# Patient Record
Sex: Female | Born: 1995 | Race: Black or African American | Hispanic: No | Marital: Single | Smoking: Former smoker
Health system: Southern US, Community
[De-identification: ages and names within clinical notes are randomized; demographics above are authoritative.]

## PROBLEM LIST (undated history)

## (undated) ENCOUNTER — Inpatient Hospital Stay (HOSPITAL_COMMUNITY): Payer: Self-pay

## (undated) DIAGNOSIS — Z789 Other specified health status: Secondary | ICD-10-CM

## (undated) HISTORY — PX: NO PAST SURGERIES: SHX2092

---
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
## 2016-05-24 ENCOUNTER — Ambulatory Visit (INDEPENDENT_AMBULATORY_CARE_PROVIDER_SITE_OTHER): Payer: Medicaid Other | Admitting: Obstetrics

## 2016-05-24 ENCOUNTER — Encounter: Payer: Self-pay | Admitting: *Deleted

## 2016-05-24 ENCOUNTER — Encounter: Payer: Self-pay | Admitting: Obstetrics

## 2016-05-24 ENCOUNTER — Other Ambulatory Visit (HOSPITAL_COMMUNITY)
Admission: RE | Admit: 2016-05-24 | Discharge: 2016-05-24 | Disposition: A | Discharge status: Home / Self Care | Payer: Medicaid Other | Source: Ambulatory Visit | Attending: Obstetrics | Admitting: Obstetrics

## 2016-05-24 VITALS — BP 116/75 | HR 87 | Ht 65.0 in | Wt 218.4 lb

## 2016-05-24 DIAGNOSIS — Z3041 Encounter for surveillance of contraceptive pills: Secondary | ICD-10-CM

## 2016-05-24 DIAGNOSIS — Z0000 Encounter for general adult medical examination without abnormal findings: Secondary | ICD-10-CM | POA: Diagnosis not present

## 2016-05-24 DIAGNOSIS — Z23 Encounter for immunization: Secondary | ICD-10-CM

## 2016-05-24 DIAGNOSIS — Z01419 Encounter for gynecological examination (general) (routine) without abnormal findings: Secondary | ICD-10-CM

## 2016-05-24 MED ORDER — NORETHIN ACE-ETH ESTRAD-FE 1-20 MG-MCG(24) PO TABS: 1.0000 | ORAL_TABLET | Freq: Every day | ORAL | 11 refills | Status: DC
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
## 2016-05-25 LAB — CERVICOVAGINAL ANCILLARY ONLY
Bacterial vaginitis: POSITIVE — AB
CHLAMYDIA, DNA PROBE: POSITIVE — AB
Candida vaginitis: NEGATIVE
NEISSERIA GONORRHEA: NEGATIVE
Trichomonas: NEGATIVE

## 2016-05-27 ENCOUNTER — Other Ambulatory Visit: Payer: Self-pay | Admitting: Obstetrics

## 2016-05-27 DIAGNOSIS — N760 Acute vaginitis: Secondary | ICD-10-CM

## 2016-05-27 DIAGNOSIS — A749 Chlamydial infection, unspecified: Secondary | ICD-10-CM

## 2016-05-27 DIAGNOSIS — B9689 Other specified bacterial agents as the cause of diseases classified elsewhere: Secondary | ICD-10-CM

## 2016-05-27 MED ORDER — TINIDAZOLE 500 MG PO TABS: 1000.0000 mg | ORAL_TABLET | Freq: Every day | ORAL | 2 refills | Status: DC

## 2016-05-27 MED ORDER — AZITHROMYCIN 250 MG PO TABS: 1000.0000 mg | ORAL_TABLET | Freq: Once | ORAL | 0 refills | Status: AC

## 2016-05-27 MED ORDER — CEFIXIME 400 MG PO TABS
400.0000 mg | ORAL_TABLET | Freq: Once | ORAL | 0 refills | Status: AC
Start: 2016-05-27 — End: 2016-05-27

## 2017-01-05 ENCOUNTER — Ambulatory Visit (INDEPENDENT_AMBULATORY_CARE_PROVIDER_SITE_OTHER): Payer: Medicaid Other | Admitting: Obstetrics

## 2017-01-05 ENCOUNTER — Encounter: Payer: Self-pay | Admitting: Obstetrics

## 2017-01-05 DIAGNOSIS — Z3041 Encounter for surveillance of contraceptive pills: Secondary | ICD-10-CM | POA: Diagnosis not present

## 2017-01-05 MED ORDER — NORETHIN ACE-ETH ESTRAD-FE 1-20 MG-MCG(24) PO TABS: 1.0000 | ORAL_TABLET | Freq: Every day | ORAL | 11 refills | Status: DC

## 2017-01-05 NOTE — Progress Notes (Signed)
Patient is in the office for birth control follow up. Patient reports she is doing well with her birth control and she needs a  new Rx.

## 2017-01-05 NOTE — Progress Notes (Signed)
Subjective:    Nicole Woodard is a 21 y.o. female who presents for contraception surveillance. The patient has no complaints today. The patient is sexually active. Pertinent past medical history: none.  The information documented in the HPI was reviewed and verified.  Menstrual History: OB History    Gravida Para Term Preterm AB Living   1 1 1     1    SAB TAB Ectopic Multiple Live Births         0 1       Patient's last menstrual period was 12/14/2016 (exact date).   Patient Active Problem List   Diagnosis Date Noted  . Normal labor and delivery 03/10/2015  . NVD (normal vaginal delivery) 03/10/2015   Past Medical History:  Diagnosis Date  . Medical history non-contributory     Past Surgical History:  Procedure Laterality Date  . NO PAST SURGERIES       Current Outpatient Prescriptions:  .  Norethindrone Acetate-Ethinyl Estrad-FE (BLISOVI 24 FE) 1-20 MG-MCG(24) tablet, Take 1 tablet by mouth daily., Disp: 1 Package, Rfl: 11 No Known Allergies  Social History  Substance Use Topics  . Smoking status: Never Smoker  . Smokeless tobacco: Never Used  . Alcohol use No    Family History  Problem Relation Age of Onset  . Hypertension Other   . Cancer Other        Review of Systems Constitutional: negative for weight loss Genitourinary:negative for abnormal menstrual periods and vaginal discharge   Objective:   BP 127/73   Pulse 90   Wt 207 lb 6.4 oz (94.1 kg)   LMP 12/14/2016 (Exact Date)   BMI 34.51 kg/m    PE:  Deferred   Lab Review Urine pregnancy test Labs reviewed yes Radiologic studies reviewed no  >50% of 10 min visit spent on counseling and coordination of care.    Assessment:    21 y.o., continuing OCP (estrogen/progesterone), no contraindications.   Plan:   1. Encounter for surveillance of contraceptive pills Rx: - Norethindrone Acetate-Ethinyl Estrad-FE (BLISOVI 24 FE) 1-20 MG-MCG(24) tablet; Take 1 tablet by mouth daily.  Dispense:  1 Package; Refill: 11    All questions answered. Contraception: OCP (estrogen/progesterone).     Meds ordered this encounter  Medications  . Norethindrone Acetate-Ethinyl Estrad-FE (BLISOVI 24 FE) 1-20 MG-MCG(24) tablet    Sig: Take 1 tablet by mouth daily.    Dispense:  1 Package    Refill:  11   No orders of the defined types were placed in this encounter.

## 2017-02-16 ENCOUNTER — Emergency Department (HOSPITAL_COMMUNITY)
Admission: EM | Admit: 2017-02-16 | Discharge: 2017-02-16 | Disposition: A | Discharge status: Home / Self Care | Payer: Medicaid Other | Attending: Emergency Medicine | Admitting: Emergency Medicine

## 2017-02-16 ENCOUNTER — Encounter (HOSPITAL_COMMUNITY): Payer: Self-pay | Admitting: Emergency Medicine

## 2017-02-16 DIAGNOSIS — R229 Localized swelling, mass and lump, unspecified: Secondary | ICD-10-CM

## 2017-02-16 DIAGNOSIS — Z79899 Other long term (current) drug therapy: Secondary | ICD-10-CM | POA: Insufficient documentation

## 2017-02-16 DIAGNOSIS — B9689 Other specified bacterial agents as the cause of diseases classified elsewhere: Secondary | ICD-10-CM

## 2017-02-16 DIAGNOSIS — N760 Acute vaginitis: Secondary | ICD-10-CM | POA: Diagnosis not present

## 2017-02-16 DIAGNOSIS — R2241 Localized swelling, mass and lump, right lower limb: Secondary | ICD-10-CM | POA: Insufficient documentation

## 2017-02-16 DIAGNOSIS — A5900 Urogenital trichomoniasis, unspecified: Secondary | ICD-10-CM | POA: Diagnosis not present

## 2017-02-16 DIAGNOSIS — A599 Trichomoniasis, unspecified: Secondary | ICD-10-CM

## 2017-02-16 DIAGNOSIS — B373 Candidiasis of vulva and vagina: Secondary | ICD-10-CM

## 2017-02-16 DIAGNOSIS — B3731 Acute candidiasis of vulva and vagina: Secondary | ICD-10-CM

## 2017-02-16 LAB — WET PREP, GENITAL: SPERM: NONE SEEN

## 2017-02-16 MED ORDER — LIDOCAINE HCL (PF) 1 % IJ SOLN
INTRAMUSCULAR | Status: AC
  Administered 2017-02-16: 1 mL
  Filled 2017-02-16: qty 5

## 2017-02-16 MED ORDER — AZITHROMYCIN 250 MG PO TABS
1000.0000 mg | ORAL_TABLET | Freq: Once | ORAL | Status: AC
  Administered 2017-02-16: 1000 mg via ORAL
  Filled 2017-02-16: qty 4

## 2017-02-16 MED ORDER — METRONIDAZOLE 500 MG PO TABS
500.0000 mg | ORAL_TABLET | Freq: Two times a day (BID) | ORAL | 0 refills | Status: AC
Start: 2017-02-16 — End: 2017-03-02

## 2017-02-16 MED ORDER — CEFTRIAXONE SODIUM 250 MG IJ SOLR
250.0000 mg | Freq: Once | INTRAMUSCULAR | Status: AC
  Administered 2017-02-16: 250 mg via INTRAMUSCULAR
  Filled 2017-02-16: qty 250

## 2017-02-16 MED ORDER — FLUCONAZOLE 100 MG PO TABS
150.0000 mg | ORAL_TABLET | Freq: Once | ORAL | Status: AC
  Administered 2017-02-16: 150 mg via ORAL
  Filled 2017-02-16: qty 2

## 2017-02-16 NOTE — Discharge Instructions (Signed)
[facility] lump in your right thigh and vaginal discharge. The lump in your right thigh does not look like an abscesses, this is very reassuring. You can use warm compresses for 20 minutes 3 times a day. If it starts getting worse, red, warm, or increasing size please go to your regular doctor or return to the ED as you may need further evaluation. Pelvic exam shows he has several vaginal infections including a yeast infection and bacterial vaginosis (this is not an STD). Tests from your vaginal exam also showed trichomonas. This is a sexually transmitted disease (STD) that is treated with antibiotics. You received treatment in the ED with antibiotics. He was also given a prescription for metronidazole or Flagyl. Please take one tablet twice a day for 7 days. Please avoid alcohol while you are taking this antibiotic. Your partner will need treatment for this infection as well. Please avoid sexual intercourse in the mean time.

## 2017-02-16 NOTE — ED Notes (Signed)
Pt c/o abscess at right thish. Indicates area of minimal swelling. At right interior thigh.

## 2017-02-16 NOTE — ED Triage Notes (Signed)
Pt reports boil on the right inner thigh for 2 days. Pt also reports vaginal discharge x1 week.

## 2017-02-16 NOTE — ED Provider Notes (Signed)
[facility] EMERGENCY DEPARTMENT Provider Note   CSN: [id number] Arrival date & time: 02/16/17  0707    History   Chief Complaint Chief Complaint  Patient presents with  . Abscess    HPI Nicole Woodard is a 21 y.o. female with no pat medical history who presents to the ED with R inner thigh lesion and white vaginal discharge. States she noticed a small bump 4 days ago that grew in size for 2 days. It also turned purple in color. Has not noticed any drainage from the lesion and states it has decrease in size since yesterday. States she does not sheave the area. Has not tried warm compresses at home. 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 Denies fever or chills, has been in her usual state of  Health. . Has never had anything like this before, but does report having an ingrown hair in the pubic region where regularly shaves. She also reports white vaginal discharge x1 week. Denies vaginal pain, itching, or irritation. States discharge is similar to prior yeast infections. States she was diagnosed with chlamydia 1 year ago when she was pregnant and would like to checked for STIs.   HPI  Past Medical History:  Diagnosis Date  . Medical history non-contributory     Patient Active Problem List   Diagnosis Date Noted  . Normal labor and delivery 03/10/2015  . NVD (normal vaginal delivery) 03/10/2015    Past Surgical History:  Procedure Laterality Date  . NO PAST SURGERIES      OB History    Gravida Para Term Preterm AB Living   1 1 1     1    SAB TAB Ectopic Multiple Live Births         0 1       Home Medications    Prior to Admission medications   Medication Sig Start Date End Date Taking? Authorizing Provider  Norethindrone Acetate-Ethinyl Estrad-FE (BLISOVI 24 FE) 1-20 MG-MCG(24) tablet Take 1 tablet by mouth daily. 01/05/17  Yes Brock Bad, MD  metroNIDAZOLE (FLAGYL) 500 MG tablet Take 1 tablet (500 mg total) by mouth  2 (two) times daily. 02/16/17 03/02/17  Burna Cash, MD    Family History Family History  Problem Relation Age of Onset  . Hypertension Other   . Cancer Other     Social History Social History  Substance Use Topics  . Smoking status: Never Smoker  . Smokeless tobacco: Never Used  . Alcohol use No     Allergies   Patient has no known allergies.   Review of Systems Review of Systems  Constitutional: Negative for activity change, appetite change, chills, diaphoresis, fatigue and fever.  Respiratory: Negative for cough and shortness of breath.   Cardiovascular: Negative for chest pain and palpitations.  Gastrointestinal: Negative for abdominal pain, constipation, diarrhea, nausea and vomiting.  Genitourinary: Positive for vaginal discharge. Negative for difficulty urinating, dysuria, flank pain, frequency, menstrual problem, pelvic pain, urgency, vaginal bleeding and vaginal pain.  Musculoskeletal: Negative for arthralgias and myalgias.  All other systems reviewed and are negative.    Physical Exam Updated Vital Signs BP 119/73 (BP Location: Right Arm)   Pulse 84   Temp 99.1 F (37.3 C) (Oral)   Resp 17   LMP 01/11/2017   SpO2 98%   Physical Exam  Constitutional: She appears well-developed and well-nourished. No distress.  Cardiovascular: Normal rate, regular rhythm and normal heart sounds.  Exam reveals no  gallop and no friction rub.   No murmur heard. Pulmonary/Chest: Breath sounds normal. No respiratory distress. She has no wheezes. She has no rales.  Abdominal: Soft. Bowel sounds are normal. She exhibits no distension. There is no tenderness. There is no guarding.  Genitourinary:  Genitourinary Comments: External genitalia is normal. There is a small, non-tender lump on L pubis without surrounding erythema or warmth. Vaginal mucosa is pink and with nl rugae. There is a moderate amount of white milky discharge present. Cervix looks pink and healthy. No  CTM noted.   Skin:  There is a 2x1 cm area of induration in R inner tight that is actively draining a small amount of blood. No pus noted.  It is not associated with surrounding warmth or erythema. No fluctuance noted.     ED Treatments / Results  Labs (all labs ordered are listed, but only abnormal results are displayed) Labs Reviewed  WET PREP, GENITAL - Abnormal; Notable for the following:       Result Value   Yeast Wet Prep HPF POC PRESENT (*)    Trich, Wet Prep PRESENT (*)    Clue Cells Wet Prep HPF POC PRESENT (*)    WBC, Wet Prep HPF POC MODERATE (*)    All other components within normal limits  RPR  HIV ANTIBODY (ROUTINE TESTING)  GC/CHLAMYDIA PROBE AMP ([place] NOT [facility]  WET PREP  (BD AFFIRM) ([place])    EKG  EKG Interpretation None       Radiology No results found.  Procedures Procedures (including critical care time)  Medications Ordered in ED Medications  cefTRIAXone (ROCEPHIN) injection 250 mg (not administered)  azithromycin (ZITHROMAX) tablet 1,000 mg (not administered)  fluconazole (DIFLUCAN) tablet 150 mg (not administered)     Initial Impression / Assessment and Plan / ED Course  I have reviewed the triage vital signs and the nursing notes.  Pertinent labs & imaging results that were available during my care of the patient were reviewed by me and considered in my medical decision making (see chart for details).    NWGNFAOShakiya Dorris Carnes Woodard is a 21 y.o. female with no pat medical history who presents to the ED with R inner thigh lesion and white vaginal discharge. Patient does not report signs/symptoms concerning for infection a this time. She is afebrile and hemodynamically stable. There is a 2x1 cm mildly tender lump on R inner thigh with bloody discharge that is not associated with warmth or erythema. Low suspicion for abscess at this time. Will recommend warm compresses and follow up with PCP if no improvement. Regarding vaginal discharge,  pelvic exam done. No abnormalities observed on exam and no CTM. Wet prep and GC/chlamydia sent as well as RPR and HIV per patient request. Low suspicion for PID at this time.   0902 Wet prep with yeast, trichomonas and clue cells. Will give IM rocephin x1, azithromycin 1g x1, PO diflucan 150 mg x1, and prescription for flagyl 500 mg BID x7 days.  Will d/c home after she receives treatments. Advised to avoid alcohol while on flagyl. Advised partner will need treatment for trichomonas as well and to avoid sexual intercourse while partner gets treated for trichomonas. Advised to use warm compresses in R inner thigh lesion. Return precautions discussed. Patient voiced understanding and is in agreement with plan. Will follow up with labs and contact patient if abnormal results.   Case discussed with Dr. Jacqulyn BathLong.   Final Clinical Impressions(s) / ED Diagnoses  Final diagnoses:  Bacterial vaginosis  Candidiasis of vagina  Trichomonas vaginalis infection  Lump of skin    New Prescriptions New Prescriptions   METRONIDAZOLE (FLAGYL) 500 MG TABLET    Take 1 tablet (500 mg total) by mouth 2 (two) times daily.     Burna Cash, MD 02/16/17 1610    Maia Plan, MD 02/17/17 [phone]

## 2017-02-17 LAB — GC/CHLAMYDIA PROBE AMP (~~LOC~~) NOT AT ARMC
CHLAMYDIA, DNA PROBE: POSITIVE — AB
Neisseria Gonorrhea: NEGATIVE

## 2017-02-17 LAB — HIV ANTIBODY (ROUTINE TESTING W REFLEX): HIV Screen 4th Generation wRfx: NONREACTIVE

## 2017-02-17 LAB — RPR: RPR: NONREACTIVE

## 2017-03-16 ENCOUNTER — Ambulatory Visit: Payer: Medicaid Other | Admitting: Obstetrics

## 2017-03-17 ENCOUNTER — Other Ambulatory Visit (HOSPITAL_COMMUNITY)
Admission: RE | Admit: 2017-03-17 | Discharge: 2017-03-17 | Disposition: A | Discharge status: Home / Self Care | Payer: Medicaid Other | Source: Ambulatory Visit | Attending: Obstetrics | Admitting: Obstetrics

## 2017-03-17 ENCOUNTER — Encounter: Payer: Self-pay | Admitting: Obstetrics

## 2017-03-17 ENCOUNTER — Ambulatory Visit (INDEPENDENT_AMBULATORY_CARE_PROVIDER_SITE_OTHER): Payer: Medicaid Other | Admitting: Obstetrics

## 2017-03-17 VITALS — BP 126/84 | HR 69 | Wt 206.0 lb

## 2017-03-17 DIAGNOSIS — Z113 Encounter for screening for infections with a predominantly sexual mode of transmission: Secondary | ICD-10-CM | POA: Insufficient documentation

## 2017-03-17 DIAGNOSIS — A599 Trichomoniasis, unspecified: Secondary | ICD-10-CM

## 2017-03-17 DIAGNOSIS — N898 Other specified noninflammatory disorders of vagina: Secondary | ICD-10-CM | POA: Diagnosis present

## 2017-03-17 DIAGNOSIS — Z23 Encounter for immunization: Secondary | ICD-10-CM | POA: Diagnosis not present

## 2017-03-17 NOTE — Progress Notes (Signed)
Patient ID: Nicole Woodard, female   DOB: [birth year], 21 y.o.   MRN: [id number]  Chief Complaint  Patient presents with  . Gynecologic Exam    pt having d/c and would like check, std testing     HPI Nicole CreekShakiya N Certain is a 21 y.o. female.  Increased vaginal discharge.  Denies vaginal odor or itching. HPI  Past Medical History:  Diagnosis Date  . Medical history non-contributory     Past Surgical History:  Procedure Laterality Date  . NO PAST SURGERIES      Family History  Problem Relation Age of Onset  . Hypertension Other   . Cancer Other     Social History Social History   Tobacco Use  . Smoking status: Never Smoker  . Smokeless tobacco: Never Used  Substance Use Topics  . Alcohol use: No  . Drug use: No    No Known Allergies  Current Outpatient Medications  Medication Sig Dispense Refill  . Norethindrone Acetate-Ethinyl Estrad-FE (BLISOVI 24 FE) 1-20 MG-MCG(24) tablet Take 1 tablet by mouth daily. 1 Package 11   No current facility-administered medications for this visit.     Review of Systems Review of Systems Constitutional: negative for fatigue and weight loss Respiratory: negative for cough and wheezing Cardiovascular: negative for chest pain, fatigue and palpitations Gastrointestinal: negative for abdominal pain and change in bowel habits Genitourinary:positive for vaginal discharge Integument/breast: negative for nipple discharge Musculoskeletal:negative for myalgias Neurological: negative for gait problems and tremors Behavioral/Psych: negative for abusive relationship, depression Endocrine: negative for temperature intolerance      Blood pressure 126/84, pulse 69, weight 206 lb (93.4 kg), last menstrual period 02/10/2017.  Physical Exam Physical Exam           General:  Alert and no distress Abdomen:  normal findings: no organomegaly, soft, non-tender and no hernia  Pelvis:  External genitalia: normal general appearance Urinary system:  urethral meatus normal and bladder without fullness, nontender Vaginal: normal without tenderness, induration or masses Cervix: normal appearance Adnexa: normal bimanual exam Uterus: anteverted and non-tender, normal size    50% of 15 min visit spent on counseling and coordination of care.    Data Reviewed Wet prep and cultures  Assessment     1. Vaginal discharge Rx: - Cervicovaginal ancillary only  2. Trichomonas vaginalis infection - treated.  Partner also treated.  3. Routine screening for STI (sexually transmitted infection) Rx: - Cervicovaginal ancillary only  4. Flu vaccine need Rx: - Flu Vaccine QUAD 36+ mos IM (Fluarix, Quad PF)    Plan    Follow up in 3 months for TOC   No orders of the defined types were placed in this encounter.  No orders of the defined types were placed in this encounter.

## 2017-03-18 LAB — CERVICOVAGINAL ANCILLARY ONLY
BACTERIAL VAGINITIS: NEGATIVE
Candida vaginitis: NEGATIVE
Chlamydia: NEGATIVE
Neisseria Gonorrhea: NEGATIVE
Trichomonas: POSITIVE — AB

## 2017-03-19 ENCOUNTER — Other Ambulatory Visit: Payer: Self-pay | Admitting: Obstetrics

## 2017-03-19 DIAGNOSIS — A5901 Trichomonal vulvovaginitis: Secondary | ICD-10-CM

## 2017-03-19 MED ORDER — METRONIDAZOLE 500 MG PO TABS: 2000.0000 mg | ORAL_TABLET | Freq: Once | ORAL | 0 refills | Status: AC

## 2017-04-06 ENCOUNTER — Other Ambulatory Visit: Payer: Self-pay

## 2017-04-06 ENCOUNTER — Emergency Department (HOSPITAL_COMMUNITY)
Admission: EM | Admit: 2017-04-06 | Discharge: 2017-04-06 | Disposition: A | Discharge status: Home / Self Care | Payer: Medicaid Other | Attending: Emergency Medicine | Admitting: Emergency Medicine

## 2017-04-06 ENCOUNTER — Encounter (HOSPITAL_COMMUNITY): Payer: Self-pay | Admitting: Emergency Medicine

## 2017-04-06 DIAGNOSIS — R51 Headache: Secondary | ICD-10-CM | POA: Diagnosis not present

## 2017-04-06 DIAGNOSIS — N898 Other specified noninflammatory disorders of vagina: Secondary | ICD-10-CM

## 2017-04-06 DIAGNOSIS — Z79899 Other long term (current) drug therapy: Secondary | ICD-10-CM | POA: Insufficient documentation

## 2017-04-06 LAB — WET PREP, GENITAL
CLUE CELLS WET PREP: NONE SEEN
Sperm: NONE SEEN
TRICH WET PREP: NONE SEEN
Yeast Wet Prep HPF POC: NONE SEEN

## 2017-04-06 LAB — URINALYSIS, ROUTINE W REFLEX MICROSCOPIC
Bilirubin Urine: NEGATIVE
Glucose, UA: NEGATIVE mg/dL
Hgb urine dipstick: NEGATIVE
Ketones, ur: NEGATIVE mg/dL
Leukocytes, UA: NEGATIVE
Nitrite: NEGATIVE
Protein, ur: NEGATIVE mg/dL
Specific Gravity, Urine: 1.027 (ref 1.005–1.030)
pH: 6 (ref 5.0–8.0)

## 2017-04-06 LAB — POC URINE PREG, ED: PREG TEST UR: NEGATIVE

## 2017-04-06 MED ORDER — CEFTRIAXONE SODIUM 250 MG IJ SOLR
250.0000 mg | Freq: Once | INTRAMUSCULAR | Status: AC
  Administered 2017-04-06: 250 mg via INTRAMUSCULAR
  Filled 2017-04-06: qty 250

## 2017-04-06 MED ORDER — AZITHROMYCIN 250 MG PO TABS
1000.0000 mg | ORAL_TABLET | Freq: Once | ORAL | Status: AC
  Administered 2017-04-06: 1000 mg via ORAL
  Filled 2017-04-06: qty 4

## 2017-04-06 NOTE — Discharge Instructions (Signed)
You will be called in 3 days if you test positive for gonorrhea or chlamydia.  Please make all of your sexual partners aware that they will need to be treated as well and abstain for 1 week after both we have been treated.  You can go to the health department in the future for free STD checks.  Please return to the emergency department if you develop any new or worsening symptoms.

## 2017-04-06 NOTE — ED Provider Notes (Signed)
[facility] EMERGENCY DEPARTMENT Provider Note   CSN: [id number] Arrival date & time: 04/06/17  0710     History   Chief Complaint Chief Complaint  Patient presents with  . Vaginal Discharge  . Headache    HPI Nicole Woodard is a 21 y.o. female with recent diagnosis and treatment of trichomonas who presents with a one-week history of vaginal discharge and irritation.  She reports that she may have been reinfected by her boyfriend, who was treated for trichomonas as well.  Patient was also treated for GC, chlamydia at that time.  She reports her symptoms resolved, however they returned 1 week ago.  She denies any pelvic pain, abdominal pain, nausea, vomiting, fevers.  She denies any abnormal vaginal bleeding.  LMP 03/21/2017.  Patient also reports a 3-day history of intermittent right sided headache behind her right eye.  She reports that she has had sharp pain that seems to be worse with movement.  She denies any neck pain.  Her pain only lasts for a few minutes at a time.  It is not severe and takes several minutes to reach max intensity.  It has been improved with Tylenol at home.  She does not have any headache at this time.  She denies any vision changes.  HPI  Past Medical History:  Diagnosis Date  . Medical history non-contributory     Patient Active Problem List   Diagnosis Date Noted  . Normal labor and delivery 03/10/2015  . NVD (normal vaginal delivery) 03/10/2015    Past Surgical History:  Procedure Laterality Date  . NO PAST SURGERIES      OB History    Gravida Para Term Preterm AB Living   1 1 1     1    SAB TAB Ectopic Multiple Live Births         0 1       Home Medications    Prior to Admission medications   Medication Sig Start Date End Date Taking? Authorizing Provider  Norethindrone Acetate-Ethinyl Estrad-FE (BLISOVI 24 FE) 1-20 MG-MCG(24) tablet Take 1 tablet by mouth daily. Patient taking differently: Take 1 tablet by mouth  at bedtime.  01/05/17  Yes Brock Bad, MD    Family History Family History  Problem Relation Age of Onset  . Hypertension Other   . Cancer Other     Social History Social History   Tobacco Use  . Smoking status: Never Smoker  . Smokeless tobacco: Never Used  Substance Use Topics  . Alcohol use: No  . Drug use: No     Allergies   Patient has no known allergies.   Review of Systems Review of Systems  Constitutional: Negative for chills and fever.  HENT: Negative for facial swelling and sore throat.   Respiratory: Negative for shortness of breath.   Cardiovascular: Negative for chest pain.  Gastrointestinal: Negative for abdominal pain, nausea and vomiting.  Genitourinary: Positive for vaginal discharge. Negative for dysuria, pelvic pain and vaginal bleeding.  Musculoskeletal: Negative for back pain.  Skin: Negative for rash and wound.  Neurological: Positive for headaches.  Psychiatric/Behavioral: The patient is not nervous/anxious.      Physical Exam Updated Vital Signs BP 105/64 (BP Location: Right Arm)   Pulse 70   Temp 98.7 F (37.1 C) (Oral)   Resp 12   LMP 03/21/2017   SpO2 100%   Physical Exam  Constitutional: She appears well-developed and well-nourished. No distress.  HENT:  Head: Normocephalic  and atraumatic.  Mouth/Throat: Oropharynx is clear and moist. No oropharyngeal exudate.  Eyes: Conjunctivae are normal. Pupils are equal, round, and reactive to light. Right eye exhibits no discharge. Left eye exhibits no discharge. No scleral icterus.  Neck: Normal range of motion. Neck supple. No thyromegaly present.  Cardiovascular: Normal rate, regular rhythm, normal heart sounds and intact distal pulses. Exam reveals no gallop and no friction rub.  No murmur heard. Pulmonary/Chest: Effort normal and breath sounds normal. No stridor. No respiratory distress. She has no wheezes. She has no rales.  Abdominal: Soft. Bowel sounds are normal. She exhibits  no distension. There is no tenderness. There is no rebound and no guarding.  Genitourinary: Uterus normal. Cervix exhibits discharge. Cervix exhibits no motion tenderness. Right adnexum displays no mass, no tenderness and no fullness. Left adnexum displays no mass, no tenderness and no fullness. Vaginal discharge (white, four odor) found.  Musculoskeletal: She exhibits no edema.  Lymphadenopathy:    She has no cervical adenopathy.  Neurological: She is alert. Coordination normal. GCS eye subscore is 4. GCS verbal subscore is 5. GCS motor subscore is 6.  CN 3-12 intact; normal sensation throughout; 5/5 strength in all 4 extremities; equal bilateral grip strength  Skin: Skin is warm and dry. No rash noted. She is not diaphoretic. No pallor.  Psychiatric: She has a normal mood and affect.  Nursing note and vitals reviewed.    ED Treatments / Results  Labs (all labs ordered are listed, but only abnormal results are displayed) Labs Reviewed  WET PREP, GENITAL - Abnormal; Notable for the following components:      Result Value   WBC, Wet Prep HPF POC MODERATE (*)    All other components within normal limits  URINALYSIS, ROUTINE W REFLEX MICROSCOPIC - Abnormal; Notable for the following components:   APPearance HAZY (*)    All other components within normal limits  POC URINE PREG, ED  GC/CHLAMYDIA PROBE AMP ([place]) NOT AT [facility]    EKG  EKG Interpretation None       Radiology No results found.  Procedures Procedures (including critical care time)  Medications Ordered in ED Medications  cefTRIAXone (ROCEPHIN) injection 250 mg (250 mg Intramuscular Given 04/06/17 1313)  azithromycin (ZITHROMAX) tablet 1,000 mg (1,000 mg Oral Given 04/06/17 1313)     Initial Impression / Assessment and Plan / ED Course  I have reviewed the triage vital signs and the nursing notes.  Pertinent labs & imaging results that were available during my care of the patient were reviewed by me and  considered in my medical decision making (see chart for details).     Patient with a one-week history of vaginal discharge and irritation.  Wet prep negative for yeast, Trichomonas, clue cells, but with moderate white blood cells.  GC/chlamydia sent.  Patient declined HIV and RPR testing.  UA is negative.  Urine pregnancy negative.  Will treat prophylactically with Rocephin and azithromycin.  Patient advised she will be called in 3 days.  She is advised she should tell all of her sexual partners to be treated as well.  She is advised to follow-up at the health department for STD checks in the future.  Normal neuro exam without focal deficits in the ED.  Headache occurred 1 time per a few seconds in the ED, however resolved quickly.  Will refer to PCP for further evaluation and treatment of headache.  Strict return precautions given.  Patient understands and agrees with plan.  Patient  vitals stable throughout ED course and discharged in satisfactory condition. I discussed patient case with Dr. Jacqulyn BathLong who guided the patient's management and agrees with plan.   Final Clinical Impressions(s) / ED Diagnoses   Final diagnoses:  Vaginal discharge    ED Discharge Orders    None       Emi HolesLaw, Carlye Panameno M, PA-C 04/06/17 1638    Maia PlanLong, Joshua G, MD 04/07/17 1056

## 2017-04-06 NOTE — ED Triage Notes (Signed)
Pt to ER with complaint of vaginal discharge and "irritation" x1 week. States treated for trichomoniasis 3-4 weeks ago but reports the symptoms have returned and believes her significant other "gave it back." also reports headache. NAD. Ambulatory. VSS.

## 2017-04-07 LAB — GC/CHLAMYDIA PROBE AMP (~~LOC~~) NOT AT ARMC
Chlamydia: NEGATIVE
Neisseria Gonorrhea: NEGATIVE

## 2017-04-18 ENCOUNTER — Encounter: Payer: Self-pay | Admitting: Obstetrics

## 2017-04-18 ENCOUNTER — Ambulatory Visit (INDEPENDENT_AMBULATORY_CARE_PROVIDER_SITE_OTHER): Payer: Medicaid Other | Admitting: Obstetrics

## 2017-04-18 VITALS — BP 139/79 | HR 90 | Wt 216.2 lb

## 2017-04-18 DIAGNOSIS — Z3041 Encounter for surveillance of contraceptive pills: Secondary | ICD-10-CM | POA: Diagnosis not present

## 2017-04-18 DIAGNOSIS — A599 Trichomoniasis, unspecified: Secondary | ICD-10-CM | POA: Diagnosis not present

## 2017-04-18 NOTE — Progress Notes (Signed)
[facility] Pt has no other concerns today.

## 2017-04-18 NOTE — Progress Notes (Signed)
Patient ID: Nicole Woodard, female   DOB: Dec 11, 1995, 21 y.o.   MRN: [id number]  Chief Complaint  Patient presents with  . Follow-up    TOC     HPI Nicole CreekShakiya N Emmerich is a 21 y.o. female.  [facility] HPI  Past Medical History:  Diagnosis Date  . Medical history non-contributory     Past Surgical History:  Procedure Laterality Date  . NO PAST SURGERIES      Family History  Problem Relation Age of Onset  . Hypertension Other   . Cancer Other     Social History Social History   Tobacco Use  . Smoking status: Never Smoker  . Smokeless tobacco: Never Used  Substance Use Topics  . Alcohol use: No  . Drug use: No    No Known Allergies  Current Outpatient Medications  Medication Sig Dispense Refill  . Norethindrone Acetate-Ethinyl Estrad-FE (BLISOVI 24 FE) 1-20 MG-MCG(24) tablet Take 1 tablet by mouth daily. (Patient taking differently: Take 1 tablet by mouth at bedtime. ) 1 Package 11   No current facility-administered medications for this visit.     Review of Systems Review of Systems Constitutional: negative for fatigue and weight loss Respiratory: negative for cough and wheezing Cardiovascular: negative for chest pain, fatigue and palpitations Gastrointestinal: negative for abdominal pain and change in bowel habits Genitourinary:negative Integument/breast: negative for nipple discharge Musculoskeletal:negative for myalgias Neurological: negative for gait problems and tremors Behavioral/Psych: negative for abusive relationship, depression Endocrine: negative for temperature intolerance      Blood pressure 139/79, pulse 90, weight 216 lb 3.2 oz (98.1 kg), last menstrual period 03/21/2017.  Physical Exam Physical Exam:  Deferred  >50% of 10 min visit spent on counseling and coordination of care.   Data Reviewed Wet prep and cultures  Assessment     1. Trichimoniasis - treated, and TOC cultures negative     Plan    Follow up in 8 months for Annual.  No orders of the defined types were placed in this encounter.  No orders of the defined types were placed in this encounter.

## 2017-07-07 ENCOUNTER — Ambulatory Visit: Payer: Medicaid Other | Admitting: Obstetrics

## 2017-09-05 ENCOUNTER — Emergency Department (HOSPITAL_COMMUNITY)
Admission: EM | Admit: 2017-09-05 | Discharge: 2017-09-05 | Disposition: A | Discharge status: Home / Self Care | Payer: Medicaid Other | Attending: Emergency Medicine | Admitting: Emergency Medicine

## 2017-09-05 ENCOUNTER — Encounter (HOSPITAL_COMMUNITY): Payer: Self-pay

## 2017-09-05 ENCOUNTER — Other Ambulatory Visit: Payer: Self-pay

## 2017-09-05 DIAGNOSIS — Z79899 Other long term (current) drug therapy: Secondary | ICD-10-CM | POA: Insufficient documentation

## 2017-09-05 DIAGNOSIS — N760 Acute vaginitis: Secondary | ICD-10-CM | POA: Diagnosis not present

## 2017-09-05 DIAGNOSIS — N898 Other specified noninflammatory disorders of vagina: Secondary | ICD-10-CM

## 2017-09-05 DIAGNOSIS — B9689 Other specified bacterial agents as the cause of diseases classified elsewhere: Secondary | ICD-10-CM

## 2017-09-05 LAB — URINALYSIS, ROUTINE W REFLEX MICROSCOPIC
BILIRUBIN URINE: NEGATIVE
Glucose, UA: NEGATIVE mg/dL
Hgb urine dipstick: NEGATIVE
KETONES UR: NEGATIVE mg/dL
Nitrite: NEGATIVE
PH: 6 (ref 5.0–8.0)
PROTEIN: NEGATIVE mg/dL
Specific Gravity, Urine: 1.016 (ref 1.005–1.030)

## 2017-09-05 LAB — WET PREP, GENITAL
Sperm: NONE SEEN
TRICH WET PREP: NONE SEEN
YEAST WET PREP: NONE SEEN

## 2017-09-05 LAB — PREGNANCY, URINE: Preg Test, Ur: NEGATIVE

## 2017-09-05 MED ORDER — METRONIDAZOLE 500 MG PO TABS: 500.0000 mg | ORAL_TABLET | Freq: Two times a day (BID) | ORAL | 0 refills | Status: DC

## 2017-09-05 MED ORDER — LIDOCAINE HCL (PF) 1 % IJ SOLN
INTRAMUSCULAR | Status: AC
  Administered 2017-09-05: 0.9 mL
  Filled 2017-09-05: qty 5

## 2017-09-05 MED ORDER — CEFTRIAXONE SODIUM 250 MG IJ SOLR
250.0000 mg | Freq: Once | INTRAMUSCULAR | Status: AC
Start: 2017-09-05 — End: 2017-09-05
  Administered 2017-09-05: 250 mg via INTRAMUSCULAR
  Filled 2017-09-05: qty 250

## 2017-09-05 MED ORDER — AZITHROMYCIN 250 MG PO TABS
1000.0000 mg | ORAL_TABLET | Freq: Once | ORAL | Status: AC
  Administered 2017-09-05: 1000 mg via ORAL
  Filled 2017-09-05: qty 4

## 2017-09-05 NOTE — Discharge Instructions (Addendum)
You were seen in the emergency department for vaginal discharge and sore throat. You were treated for STDs including gonorrhea and chlamydia, prophylactically.  We will call you with the results of your gonorrhea/chlamydia cultures-if positive you will need to tell all sexual partners and they may seek evaluation and potential treatment.  Do not have intercourse for the next 7 days.  We are additionally are you  treating for bacterial vaginosis, this is not a sexually transmitted infection, we are treating this with Flagyl, this is an antibiotic.  Do not consume alcohol when taking this medication as it can have serious side effects. Please take all of your antibiotics until finished. You may develop abdominal discomfort or diarrhea from the antibiotic.  You may help offset this with probiotics which you can buy at the store (ask your pharmacist if unable to find) or get probiotics in the form of eating yogurt. Do not eat or take the probiotics until 2 hours after your antibiotic. If you are unable to tolerate these side effects follow-up with your primary care provider or return to the emergency department.   If you begin to experience any blistering, rashes, swelling, or difficulty breathing seek medical care for evaluation of potentially more serious side effects.   Please be aware that this medication may interact with other medications you are taking, please be sure to discuss your medication list with your pharmacist. If you are taking birth control the antibiotic will deactivate your birth control for 2 weeks.   Follow-up with your OB/GYN or primary care provider in 1 week for reevaluation.  Return to the ER for any new or worsening symptoms including but not limited to fever, abdominal pain, vaginal bleeding, or any other concerns.

## 2017-09-05 NOTE — ED Notes (Signed)
Pt urinated prior to coming back to Triage. Pt unable to urinate at this time. Pt given apple juice, per Asher Muir - RN.

## 2017-09-05 NOTE — ED Notes (Signed)
Main lab called to add on urine pregnancy

## 2017-09-05 NOTE — ED Triage Notes (Signed)
Patient complains of 3 days of vaginal discharge denies dysuria, no abdominal pain. Also complains of vague sore throat, none on assessment

## 2017-09-05 NOTE — ED Provider Notes (Signed)
[facility] EMERGENCY DEPARTMENT Provider Note   CSN: [id number] Arrival date & time: 09/05/17  9562     History   Chief Complaint No chief complaint on file.   HPI Nicole Woodard is a 22 y.o. female who presents to the ED with complaints of vaginal discharge x 3 days. Patient describes the discharge as being white- it is similar to previous STDs, but also similar to previous normal discharge she has had. She states that she had intercourse (vaginal/oral) 7 days prior and did not use protection, prompting her concern for possible STD- she states she developed discharge a few days after this. She also had minimal sore throat which is fairly resolved at this time. She has had congestion which she attributes to allergies which is baseline for her. No alleviating/aggravating factors to her symptoms. She denies fever, chills, N/V/D, abdominal pain, dysuria, urinary frequency/urgency, or vaginal bleeding.  HPI  Past Medical History:  Diagnosis Date  . Medical history non-contributory     Patient Active Problem List   Diagnosis Date Noted  . Normal labor and delivery 03/10/2015  . NVD (normal vaginal delivery) 03/10/2015    Past Surgical History:  Procedure Laterality Date  . NO PAST SURGERIES       OB History    Gravida  1   Para  1   Term  1   Preterm      AB      Living  1     SAB      TAB      Ectopic      Multiple  0   Live Births  1            Home Medications    Prior to Admission medications   Medication Sig Start Date End Date Taking? Authorizing Provider  Norethindrone Acetate-Ethinyl Estrad-FE (BLISOVI 24 FE) 1-20 MG-MCG(24) tablet Take 1 tablet by mouth daily. Patient taking differently: Take 1 tablet by mouth at bedtime.  01/05/17  Yes Brock Bad, MD    Family History Family History  Problem Relation Age of Onset  . Hypertension Other   . Cancer Other     Social History Social History   Tobacco Use  .  Smoking status: Never Smoker  . Smokeless tobacco: Never Used  Substance Use Topics  . Alcohol use: No  . Drug use: No     Allergies   Patient has no known allergies.   Review of Systems Review of Systems  Constitutional: Negative for chills and fever.  HENT: Positive for congestion and sore throat (fairly resolved). Negative for ear pain, trouble swallowing and voice change.   Respiratory: Negative for shortness of breath.   Cardiovascular: Negative for chest pain.  Gastrointestinal: Negative for abdominal pain, blood in stool, constipation, diarrhea, nausea and vomiting.  Genitourinary: Positive for vaginal discharge. Negative for dysuria, flank pain, urgency and vaginal bleeding.  All other systems reviewed and are negative.    Physical Exam Updated Vital Signs BP 116/70   Pulse 74   Temp 98.6 F (37 C) (Oral)   Resp 15   SpO2 100%   Physical Exam  Constitutional: She appears well-developed and well-nourished. No distress.  HENT:  Head: Normocephalic and atraumatic.  Right Ear: Tympanic membrane normal.  Left Ear: Tympanic membrane normal.  Nose: Mucosal edema (somewhat boggy turbinate with mild congestion ) present. Right sinus exhibits no maxillary sinus tenderness and no frontal sinus tenderness. Left sinus exhibits no maxillary  sinus tenderness and no frontal sinus tenderness.  Mouth/Throat: Uvula is midline and oropharynx is clear and moist. No oropharyngeal exudate or posterior oropharyngeal erythema.  Tolerating her own secretions without difficulty, no trismus, no drooling, no hot potato voice.   Eyes: Conjunctivae are normal. Right eye exhibits no discharge. Left eye exhibits no discharge.  Neck: Normal range of motion. Neck supple.  Cardiovascular: Normal rate and regular rhythm.  No murmur heard. Pulmonary/Chest: Breath sounds normal. No respiratory distress. She has no wheezes. She has no rales.  Abdominal: Soft. She exhibits no distension. There is no  tenderness. There is no rigidity, no rebound, no guarding and no CVA tenderness.  Genitourinary: Pelvic exam was performed with patient supine. There is no tenderness or lesion on the right labia. There is no tenderness or lesion on the left labia. Cervix exhibits discharge. Cervix exhibits no motion tenderness and no friability. Right adnexum displays no mass, no tenderness and no fullness. Left adnexum displays no mass, no tenderness and no fullness. No erythema or bleeding in the vagina. Vaginal discharge (white somewhat thick, mild to moderate amount) found.  Genitourinary Comments: EDT Tanya present as chaperone.    Lymphadenopathy:    She has no cervical adenopathy.  Neurological: She is alert.  Clear speech.   Skin: Skin is warm and dry. No rash noted.  Psychiatric: She has a normal mood and affect. Her behavior is normal.  Nursing note and vitals reviewed.    ED Treatments / Results  Labs (all labs ordered are listed, but only abnormal results are displayed) Results for orders placed or performed during the hospital encounter of 09/05/17  Wet prep, genital  Result Value Ref Range   Yeast Wet Prep HPF POC NONE SEEN NONE SEEN   Trich, Wet Prep NONE SEEN NONE SEEN   Clue Cells Wet Prep HPF POC PRESENT (A) NONE SEEN   WBC, Wet Prep HPF POC MANY (A) NONE SEEN   Sperm NONE SEEN   Urinalysis, Routine w reflex microscopic- may I&O cath if menses  Result Value Ref Range   Color, Urine YELLOW YELLOW   APPearance HAZY (A) CLEAR   Specific Gravity, Urine 1.016 1.005 - 1.030   pH 6.0 5.0 - 8.0   Glucose, UA NEGATIVE NEGATIVE mg/dL   Hgb urine dipstick NEGATIVE NEGATIVE   Bilirubin Urine NEGATIVE NEGATIVE   Ketones, ur NEGATIVE NEGATIVE mg/dL   Protein, ur NEGATIVE NEGATIVE mg/dL   Nitrite NEGATIVE NEGATIVE   Leukocytes, UA TRACE (A) NEGATIVE   RBC / HPF 0-5 0 - 5 RBC/hpf   WBC, UA 6-10 0 - 5 WBC/hpf   Bacteria, UA RARE (A) NONE SEEN   Squamous Epithelial / LPF 6-10 0 - 5   Mucus  PRESENT   Pregnancy, urine  Result Value Ref Range   Preg Test, Ur NEGATIVE NEGATIVE   No results found. EKG None  Radiology No results found.  Procedures Procedures (including critical care time)  Medications Ordered in ED Medications  azithromycin (ZITHROMAX) tablet 1,000 mg (has no administration in time range)  cefTRIAXone (ROCEPHIN) injection 250 mg (has no administration in time range)     Initial Impression / Assessment and Plan / ED Course  I have reviewed the triage vital signs and the nursing notes.  Pertinent labs & imaging results that were available during my care of the patient were reviewed by me and considered in my medical decision making (see chart for details).   Patient presents with complaint of vaginal discharge.  Patient is nontoxic appearing, in no apparent distress, vitals WNL. Exam as above. UA without obvious infection, appears contaminated, no urinary sxs. Urine pregnancy is negative. Wet prep notable for findings consistent with BV. Patient requesting prophylaxis for GC/chlamydia- Treated in the ER with Azithromycin, Rocephin. Will DC home with metronidazole for BV coverage- explained patient is not to consume alcohol with this medication. Offered testing for HIV/syphilis- she states she has this recently and declines. Patient is without abdominal tenderness, adnexal tenderness, or CMT. Do not suspect PID. Do not suspect ectopic pregnancy. She additionally mentioned mild sore throat- benign exam, Centor criteria of 0 doubt strep pharyngitis, no evidence of RPA/PTA on exam. Will DC home with PCP follow up. I discussed results, treatment plan, need for PCP follow-up, and return precautions with the patient. Provided opportunity for questions, patient confirmed understanding and is in agreement with plan.   Final Clinical Impressions(s) / ED Diagnoses   Final diagnoses:  Vaginal discharge  Bacterial vaginosis    ED Discharge Orders        Ordered     metroNIDAZOLE (FLAGYL) 500 MG tablet  2 times daily     09/05/17 [address], [place] R, PA-C 09/05/17 1759    Derwood Kaplan, MD 09/07/17 1759

## 2017-09-05 NOTE — ED Notes (Signed)
ED Provider at bedside. 

## 2017-09-05 NOTE — ED Notes (Signed)
Pelvic exam done by Samantha - PA and Rudransh Bellanca - EMT assisted. 

## 2017-09-06 LAB — GC/CHLAMYDIA PROBE AMP (~~LOC~~) NOT AT ARMC
CHLAMYDIA, DNA PROBE: POSITIVE — AB
NEISSERIA GONORRHEA: NEGATIVE

## 2017-09-09 ENCOUNTER — Emergency Department (HOSPITAL_COMMUNITY)
Admission: EM | Admit: 2017-09-09 | Discharge: 2017-09-09 | Disposition: A | Discharge status: Home / Self Care | Payer: Medicaid Other | Attending: Emergency Medicine | Admitting: Emergency Medicine

## 2017-09-09 ENCOUNTER — Encounter (HOSPITAL_COMMUNITY): Payer: Self-pay | Admitting: Emergency Medicine

## 2017-09-09 DIAGNOSIS — T192XXA Foreign body in vulva and vagina, initial encounter: Secondary | ICD-10-CM | POA: Insufficient documentation

## 2017-09-09 DIAGNOSIS — Y929 Unspecified place or not applicable: Secondary | ICD-10-CM | POA: Insufficient documentation

## 2017-09-09 DIAGNOSIS — Y999 Unspecified external cause status: Secondary | ICD-10-CM | POA: Diagnosis not present

## 2017-09-09 DIAGNOSIS — Y939 Activity, unspecified: Secondary | ICD-10-CM | POA: Diagnosis not present

## 2017-09-09 DIAGNOSIS — Z5321 Procedure and treatment not carried out due to patient leaving prior to being seen by health care provider: Secondary | ICD-10-CM | POA: Diagnosis not present

## 2017-09-09 DIAGNOSIS — X58XXXA Exposure to other specified factors, initial encounter: Secondary | ICD-10-CM | POA: Diagnosis not present

## 2017-09-09 NOTE — ED Notes (Signed)
No answer from lobby  

## 2017-09-09 NOTE — ED Notes (Signed)
Pt not answering from lobby 

## 2017-09-09 NOTE — ED Triage Notes (Signed)
Pt reports that her and her boyfriend were having sex this morning and condom came off and is stuck in her vagina and with her nails she can't get a hold of it.

## 2017-09-09 NOTE — ED Provider Notes (Cosign Needed)
Pt was apparently here for condom stuck in vagina, her name was assigned to a room and I signed up for the pt, however the pt never came back to a room. Per nursing staff, she did not respond when her name was called in the lobby. She appears to have left without being seen, SHE WAS NOT SEEN BY ME.   MDM:   ICD-10-CM   1. Patient left without being seen [address] [place], [place] 09/09/17 1535

## 2017-12-22 ENCOUNTER — Emergency Department (HOSPITAL_COMMUNITY)
Admission: EM | Admit: 2017-12-22 | Discharge: 2017-12-22 | Disposition: A | Discharge status: Home / Self Care | Payer: Medicaid Other | Attending: Emergency Medicine | Admitting: Emergency Medicine

## 2017-12-22 ENCOUNTER — Encounter (HOSPITAL_COMMUNITY): Payer: Self-pay

## 2017-12-22 ENCOUNTER — Other Ambulatory Visit: Payer: Self-pay

## 2017-12-22 DIAGNOSIS — Z79899 Other long term (current) drug therapy: Secondary | ICD-10-CM | POA: Diagnosis not present

## 2017-12-22 DIAGNOSIS — N898 Other specified noninflammatory disorders of vagina: Secondary | ICD-10-CM

## 2017-12-22 LAB — WET PREP, GENITAL
Sperm: NONE SEEN
Trich, Wet Prep: NONE SEEN
Yeast Wet Prep HPF POC: NONE SEEN

## 2017-12-22 LAB — GROUP A STREP BY PCR: Group A Strep by PCR: NOT DETECTED

## 2017-12-22 MED ORDER — AZITHROMYCIN 250 MG PO TABS
1000.0000 mg | ORAL_TABLET | Freq: Once | ORAL | Status: AC
Start: 2017-12-22 — End: 2017-12-22
  Administered 2017-12-22: 1000 mg via ORAL
  Filled 2017-12-22: qty 4

## 2017-12-22 MED ORDER — METRONIDAZOLE 500 MG PO TABS: 500.0000 mg | ORAL_TABLET | Freq: Two times a day (BID) | ORAL | 0 refills | Status: DC

## 2017-12-22 MED ORDER — CEFTRIAXONE SODIUM 250 MG IJ SOLR
250.0000 mg | Freq: Once | INTRAMUSCULAR | Status: AC
  Administered 2017-12-22: 250 mg via INTRAMUSCULAR
  Filled 2017-12-22: qty 250

## 2017-12-22 MED ORDER — LIDOCAINE HCL (PF) 1 % IJ SOLN
INTRAMUSCULAR | Status: AC
  Administered 2017-12-22: 1 mL
  Filled 2017-12-22: qty 5

## 2017-12-22 NOTE — ED Notes (Signed)
Pt reports sore throat and whitish vaginal dc x "several days."  Her son was just evaluated in the ED for URI sxs today.  Denies any fever or chills.  Reports intermittent lower abd cramping which is resolved today.  No dysuria or hematuria.

## 2017-12-22 NOTE — Discharge Instructions (Addendum)
Please read attached information. If you experience any new or worsening signs or symptoms please return to the emergency room for evaluation. Please follow-up with your primary care provider or specialist as discussed.  °

## 2017-12-22 NOTE — ED Provider Notes (Addendum)
[facility] Provider Note   CSN: [id number] Arrival date & time: 12/22/17  1319    History   Chief Complaint Chief Complaint  Patient presents with  . Sore Throat  . Vaginal Discharge    HPI Nicole Woodard is a 22 y.o. female.  HPI    22 year old female presents today with complaints of sore throat and vaginal discharge.  Patient has 3-day history of very minor sore throat and nasal congestion, she denies any difficulty swallowing breathing, she denies any fever or chills.  She notes her son was sick with upper respiratory complaints.  Patient also notes approximately 3 days ago she developed vaginal discharge, and vaginitis.  She reports she is sexually active with one female partner, denies any abdominal pain fever chills nausea or vomiting.   Past Medical History:  Diagnosis Date  . Medical history non-contributory     Patient Active Problem List   Diagnosis Date Noted  . Normal labor and delivery 03/10/2015  . NVD (normal vaginal delivery) 03/10/2015    Past Surgical History:  Procedure Laterality Date  . NO PAST SURGERIES       OB History    Gravida  1   Para  1   Term  1   Preterm      AB      Living  1     SAB      TAB      Ectopic      Multiple  0   Live Births  1            Home Medications    Prior to Admission medications   Medication Sig Start Date End Date Taking? Authorizing Provider  metroNIDAZOLE (FLAGYL) 500 MG tablet Take 1 tablet (500 mg total) by mouth 2 (two) times daily. 09/05/17   Petrucelli, Samantha R, PA-C  Norethindrone Acetate-Ethinyl Estrad-FE (BLISOVI 24 FE) 1-20 MG-MCG(24) tablet Take 1 tablet by mouth daily. Patient taking differently: Take 1 tablet by mouth at bedtime.  01/05/17   Brock BadHarper, Charles A, MD    Family History Family History  Problem Relation Age of Onset  . Hypertension Other   . Cancer Other     Social History Social History   Tobacco Use  .  Smoking status: Never Smoker  . Smokeless tobacco: Never Used  Substance Use Topics  . Alcohol use: No  . Drug use: No     Allergies   Patient has no known allergies.   Review of Systems Review of Systems  All other systems reviewed and are negative.  Physical Exam Updated Vital Signs BP 122/87   Pulse 80   Temp 98.2 F (36.8 C) (Oral)   Resp 17   SpO2 99%   Physical Exam  Constitutional: She is oriented to person, place, and time. She appears well-developed and well-nourished.  HENT:  Head: Normocephalic and atraumatic.  Eyes: Pupils are equal, round, and reactive to light. Conjunctivae are normal. Right eye exhibits no discharge. Left eye exhibits no discharge. No scleral icterus.  Neck: Normal range of motion. No JVD present. No tracheal deviation present.  Pulmonary/Chest: Effort normal. No stridor.  Genitourinary:  Genitourinary Comments: Purulent discharge noted in the vaginal vault, no cervical motion adnexal tenderness or masses  Neurological: She is alert and oriented to person, place, and time. Coordination normal.  Psychiatric: She has a normal mood and affect. Her behavior is normal. Judgment and thought content normal.  Nursing note and  vitals reviewed.   ED Treatments / Results  Labs (all labs ordered are listed, but only abnormal results are displayed) Labs Reviewed  WET PREP, GENITAL - Abnormal; Notable for the following components:      Result Value   Clue Cells Wet Prep HPF POC PRESENT (*)    WBC, Wet Prep HPF POC MANY (*)    All other components within normal limits  GROUP A STREP BY PCR  GC/CHLAMYDIA PROBE AMP ([place]) NOT AT [facility]    EKG None  Radiology No results found.  Procedures Procedures (including critical care time)  Medications Ordered in ED Medications  cefTRIAXone (ROCEPHIN) injection 250 mg (250 mg Intramuscular Given 12/22/17 1726)  azithromycin (ZITHROMAX) tablet 1,000 mg (1,000 mg Oral Given 12/22/17 1726)    lidocaine (PF) (XYLOCAINE) 1 % injection (1 mL  Given 12/22/17 1727)     Initial Impression / Assessment and Plan / ED Course  I have reviewed the triage vital signs and the nursing notes.  Pertinent labs & imaging results that were available during my care of the patient were reviewed by me and considered in my medical decision making (see chart for details).    Labs: Wet prep, GC, group A strep  Imaging:  Consults:  Therapeutics: Ceftriaxone, azithromycin  Discharge Meds:   Assessment/Plan:   22 year old female presents today with likely STD.  She has purulent discharge, she will be treated prophylactically.  Metronidazole sent to pharmacy given clue cells.  Patient also with complaints of sore throat, question mild viral pharyngitis versus allergic no signs of bacterial infection.  Strict return precautions given.  Patient verbalized understanding and agreement to today's plan.   Final Clinical Impressions(s) / ED Diagnoses   Final diagnoses:  Vaginal discharge    ED Discharge Orders    None           Rosalio Loud 12/22/17 1814    Gerhard Munch, MD 12/22/17 2140

## 2017-12-22 NOTE — ED Triage Notes (Signed)
Pt states sore throat and vaginal discharge. Son with similar URI symptoms.

## 2017-12-23 LAB — GC/CHLAMYDIA PROBE AMP (~~LOC~~) NOT AT ARMC
Chlamydia: NEGATIVE
Neisseria Gonorrhea: NEGATIVE

## 2018-04-17 ENCOUNTER — Emergency Department (HOSPITAL_COMMUNITY)
Admission: EM | Admit: 2018-04-17 | Discharge: 2018-04-17 | Discharge status: Against Medical Advice | Payer: Medicaid Other

## 2018-04-17 NOTE — ED Notes (Signed)
Pt called for triage no answer  °

## 2018-06-06 ENCOUNTER — Emergency Department (HOSPITAL_COMMUNITY)
Admission: EM | Admit: 2018-06-06 | Discharge: 2018-06-06 | Disposition: A | Discharge status: Home / Self Care | Payer: Medicaid Other | Attending: Emergency Medicine | Admitting: Emergency Medicine

## 2018-06-06 ENCOUNTER — Other Ambulatory Visit: Payer: Self-pay

## 2018-06-06 ENCOUNTER — Encounter (HOSPITAL_COMMUNITY): Payer: Self-pay | Admitting: Emergency Medicine

## 2018-06-06 DIAGNOSIS — R112 Nausea with vomiting, unspecified: Secondary | ICD-10-CM

## 2018-06-06 DIAGNOSIS — R197 Diarrhea, unspecified: Secondary | ICD-10-CM | POA: Insufficient documentation

## 2018-06-06 LAB — CBC
HCT: 38.7 % (ref 36.0–46.0)
Hemoglobin: 12 g/dL (ref 12.0–15.0)
MCH: 27.6 pg (ref 26.0–34.0)
MCHC: 31 g/dL (ref 30.0–36.0)
MCV: 89 fL (ref 80.0–100.0)
Platelets: 221 10*3/uL (ref 150–400)
RBC: 4.35 MIL/uL (ref 3.87–5.11)
RDW: 13.5 % (ref 11.5–15.5)
WBC: 13.4 10*3/uL — ABNORMAL HIGH (ref 4.0–10.5)
nRBC: 0 % (ref 0.0–0.2)

## 2018-06-06 LAB — URINALYSIS, ROUTINE W REFLEX MICROSCOPIC
Bilirubin Urine: NEGATIVE
Glucose, UA: NEGATIVE mg/dL
Ketones, ur: NEGATIVE mg/dL
Nitrite: NEGATIVE
Protein, ur: NEGATIVE mg/dL
Specific Gravity, Urine: 1.01 (ref 1.005–1.030)
pH: 6 (ref 5.0–8.0)

## 2018-06-06 LAB — COMPREHENSIVE METABOLIC PANEL
ALT: 29 U/L (ref 0–44)
AST: 76 U/L — ABNORMAL HIGH (ref 15–41)
Albumin: 4.4 g/dL (ref 3.5–5.0)
Alkaline Phosphatase: 56 U/L (ref 38–126)
Anion gap: 9 (ref 5–15)
BUN: 12 mg/dL (ref 6–20)
CALCIUM: 9.1 mg/dL (ref 8.9–10.3)
CO2: 22 mmol/L (ref 22–32)
CREATININE: 0.71 mg/dL (ref 0.44–1.00)
Chloride: 106 mmol/L (ref 98–111)
GFR calc Af Amer: >60 mL/min (ref >60)
Glucose, Bld: 133 mg/dL — ABNORMAL HIGH (ref 70–99)
Potassium: 3.4 mmol/L — ABNORMAL LOW (ref 3.5–5.1)
Sodium: 137 mmol/L (ref 135–145)
Total Bilirubin: 0.7 mg/dL (ref 0.3–1.2)
Total Protein: 8.2 g/dL — ABNORMAL HIGH (ref 6.5–8.1)

## 2018-06-06 LAB — LIPASE, BLOOD: Lipase: 32 U/L (ref 11–51)

## 2018-06-06 LAB — I-STAT BETA HCG BLOOD, ED (MC, WL, AP ONLY): I-stat hCG, quantitative: <5 m[IU]/mL (ref <5)

## 2018-06-06 MED ORDER — SODIUM CHLORIDE 0.9% FLUSH
3.0000 mL | Freq: Once | INTRAVENOUS | Status: AC
  Administered 2018-06-06: 3 mL via INTRAVENOUS

## 2018-06-06 MED ORDER — KETOROLAC TROMETHAMINE 15 MG/ML IJ SOLN
15.0000 mg | Freq: Once | INTRAMUSCULAR | Status: AC
  Administered 2018-06-06: 15 mg via INTRAVENOUS
  Filled 2018-06-06: qty 1

## 2018-06-06 MED ORDER — ONDANSETRON HCL 4 MG PO TABS: 4.0000 mg | ORAL_TABLET | Freq: Four times a day (QID) | ORAL | 0 refills | Status: DC

## 2018-06-06 MED ORDER — SODIUM CHLORIDE 0.9 % IV BOLUS
1000.0000 mL | Freq: Once | INTRAVENOUS | Status: AC
  Administered 2018-06-06: 1000 mL via INTRAVENOUS

## 2018-06-06 MED ORDER — ONDANSETRON HCL 4 MG/2ML IJ SOLN
4.0000 mg | Freq: Once | INTRAMUSCULAR | Status: AC
  Administered 2018-06-06: 4 mg via INTRAVENOUS
  Filled 2018-06-06: qty 2

## 2018-06-06 NOTE — ED Notes (Signed)
Pt ambulated to the BR with steady gait.  Family is at bedside.

## 2018-06-06 NOTE — ED Provider Notes (Signed)
[facility]-EMERGENCY DEPT Provider Note   CSN: [id number] Arrival date & time: 06/06/18  1041     History   Chief Complaint Chief Complaint  Patient presents with  . Abdominal Pain  . Emesis    HPI Nicole Woodard is a 23 y.o. female.  23 year old female with prior medical history as detailed below presents for evaluation of nausea, vomiting, and diarrhea.  Symptoms started acutely this morning.  She denies associated fever.  She denies known sick contacts.  She denies associated chest pain, shortness of breath, focal weakness, or other acute complaint.  She did not take anything for symptoms at home.  The history is provided by the patient and medical records.  Abdominal Pain  Pain location:  Generalized Pain quality: aching   Pain radiates to:  Does not radiate Pain severity:  Moderate Onset quality:  Sudden Duration:  5 hours Timing:  Constant Progression:  Waxing and waning Chronicity:  New Relieved by:  Nothing Worsened by:  Nothing Ineffective treatments:  None tried Associated symptoms: vomiting   Emesis  Associated symptoms: abdominal pain     Past Medical History:  Diagnosis Date  . Medical history non-contributory     Patient Active Problem List   Diagnosis Date Noted  . Normal labor and delivery 03/10/2015  . NVD (normal vaginal delivery) 03/10/2015    Past Surgical History:  Procedure Laterality Date  . NO PAST SURGERIES       OB History    Gravida  1   Para  1   Term  1   Preterm      AB      Living  1     SAB      TAB      Ectopic      Multiple  0   Live Births  1            Home Medications    Prior to Admission medications   Medication Sig Start Date End Date Taking? Authorizing Provider  metroNIDAZOLE (FLAGYL) 500 MG tablet Take 1 tablet (500 mg total) by mouth 2 (two) times daily. Patient not taking: Reported on 06/06/2018 12/22/17   Eyvonne Mechanic, PA-C  Norethindrone Acetate-Ethinyl  Estrad-FE (BLISOVI 24 FE) 1-20 MG-MCG(24) tablet Take 1 tablet by mouth daily. Patient not taking: Reported on 06/06/2018 01/05/17   Brock Bad, MD    Family History Family History  Problem Relation Age of Onset  . Hypertension Other   . Cancer Other     Social History Social History   Tobacco Use  . Smoking status: Never Smoker  . Smokeless tobacco: Never Used  Substance Use Topics  . Alcohol use: No  . Drug use: No     Allergies   Patient has no known allergies.   Review of Systems Review of Systems  Gastrointestinal: Positive for abdominal pain and vomiting.  All other systems reviewed and are negative.    Physical Exam Updated Vital Signs BP 127/70 (BP Location: Right Arm)   Pulse 95   Temp 98.1 F (36.7 C) (Oral)   Resp 20   Ht 5\' 5"  (1.651 m)   Wt 86.2 kg   LMP 06/03/2018 (Exact Date)   SpO2 100%   BMI 31.62 kg/m   Physical Exam Vitals signs and nursing note reviewed.  Constitutional:      General: She is not in acute distress.    Appearance: She is well-developed.     Comments: Appears  uncomfortable  HENT:     Head: Normocephalic and atraumatic.  Eyes:     Conjunctiva/sclera: Conjunctivae normal.     Pupils: Pupils are equal, round, and reactive to light.  Neck:     Musculoskeletal: Normal range of motion and neck supple.  Cardiovascular:     Rate and Rhythm: Normal rate and regular rhythm.     Heart sounds: Normal heart sounds.  Pulmonary:     Effort: Pulmonary effort is normal. No respiratory distress.     Breath sounds: Normal breath sounds.  Abdominal:     General: There is no distension.     Palpations: Abdomen is soft.     Tenderness: There is no abdominal tenderness.  Musculoskeletal: Normal range of motion.        General: No deformity.  Skin:    General: Skin is warm and dry.  Neurological:     Mental Status: She is alert and oriented to person, place, and time.      ED Treatments / Results  Labs (all labs ordered  are listed, but only abnormal results are displayed) Labs Reviewed  COMPREHENSIVE METABOLIC PANEL - Abnormal; Notable for the following components:      Result Value   Potassium 3.4 (*)    Glucose, Bld 133 (*)    Total Protein 8.2 (*)    AST 76 (*)    All other components within normal limits  CBC - Abnormal; Notable for the following components:   WBC 13.4 (*)    All other components within normal limits  LIPASE, BLOOD  URINALYSIS, ROUTINE W REFLEX MICROSCOPIC  I-STAT BETA HCG BLOOD, ED ([facility], [facility], [facility] ONLY)    EKG None  Radiology No results found.  Procedures Procedures (including critical care time)  Medications Ordered in ED Medications  sodium chloride 0.9 % bolus 1,000 mL (1,000 mLs Intravenous New Bag/Given 06/06/18 1124)  sodium chloride flush (NS) 0.9 % injection 3 mL (3 mLs Intravenous Given 06/06/18 1135)  ondansetron (ZOFRAN) injection 4 mg (4 mg Intravenous Given 06/06/18 1124)  ketorolac (TORADOL) 15 MG/ML injection 15 mg (15 mg Intravenous Given 06/06/18 1134)     Initial Impression / Assessment and Plan / ED Course  I have reviewed the triage vital signs and the nursing notes.  Pertinent labs & imaging results that were available during my care of the patient were reviewed by me and considered in my medical decision making (see chart for details).     MDM  Screen complete   Patient is presenting for evaluation of acute onset nausea, vomiting, and diarrhea.  Symptoms began acutely this morning.  Her presentation is consistent with likely viral gastroenteritis.  Screening labs obtained in the ED are without evidence of significant other acute pathology.  Patient does feel significant improved following IV Zofran and IV fluids.  She now desires discharge home.  Abdominal reexam is benign.    Patient does understand the need for close follow-up.  Strict return precautions given and understood.  Final Clinical Impressions(s) / ED Diagnoses   Final diagnoses:   Nausea vomiting and diarrhea    ED Discharge Orders         Ordered    ondansetron (ZOFRAN) 4 MG tablet  Every 6 hours     06/06/18 1420           Wynetta Fines, MD 06/06/18 1424

## 2018-06-06 NOTE — ED Notes (Signed)
Bed: WA21 Expected date:  Expected time:  Means of arrival:  Comments: EMS-abdominal pain 

## 2018-06-06 NOTE — ED Triage Notes (Signed)
Patient BIB [facility] from home for diffuse abdominal pain worse RUQ. N/V on arrival as well as diaphoretic. Pt reports started about 2hrs ago. EMS gave 100 mcg fentanyl and 4mg  iv Zofran.  Pt was told by a Dr. One yr ago she should be checked for hepatitis B, unknown reason.

## 2018-06-06 NOTE — Discharge Instructions (Addendum)
Please return for any problem.  Follow-up with your regular care provider as instructed.  Drink plenty of fluids.  Use Zofran as prescribed for treatment of nausea.

## 2018-07-20 ENCOUNTER — Ambulatory Visit (INDEPENDENT_AMBULATORY_CARE_PROVIDER_SITE_OTHER): Payer: Medicaid Other | Admitting: Obstetrics

## 2018-07-20 ENCOUNTER — Other Ambulatory Visit: Payer: Self-pay

## 2018-07-20 ENCOUNTER — Other Ambulatory Visit (HOSPITAL_COMMUNITY)
Admission: RE | Admit: 2018-07-20 | Discharge: 2018-07-20 | Disposition: A | Discharge status: Home / Self Care | Payer: Medicaid Other | Source: Ambulatory Visit | Attending: Obstetrics | Admitting: Obstetrics

## 2018-07-20 ENCOUNTER — Encounter: Payer: Self-pay | Admitting: Obstetrics

## 2018-07-20 VITALS — BP 110/68 | HR 89 | Ht 65.0 in | Wt 203.0 lb

## 2018-07-20 DIAGNOSIS — Z124 Encounter for screening for malignant neoplasm of cervix: Secondary | ICD-10-CM

## 2018-07-20 DIAGNOSIS — Z0000 Encounter for general adult medical examination without abnormal findings: Secondary | ICD-10-CM | POA: Diagnosis not present

## 2018-07-20 DIAGNOSIS — Z113 Encounter for screening for infections with a predominantly sexual mode of transmission: Secondary | ICD-10-CM

## 2018-07-20 DIAGNOSIS — Z3041 Encounter for surveillance of contraceptive pills: Secondary | ICD-10-CM

## 2018-07-20 DIAGNOSIS — N898 Other specified noninflammatory disorders of vagina: Secondary | ICD-10-CM

## 2018-07-20 DIAGNOSIS — Z01419 Encounter for gynecological examination (general) (routine) without abnormal findings: Secondary | ICD-10-CM

## 2018-07-20 MED ORDER — NORETHIN ACE-ETH ESTRAD-FE 1-20 MG-MCG(24) PO TABS: 1.0000 | ORAL_TABLET | Freq: Every day | ORAL | 11 refills | Status: DC

## 2018-07-20 NOTE — Progress Notes (Signed)
Subjective:        [facility] is a 23 y.o. female here for a routine exam.  Current complaints: None.    Personal health questionnaire:  Is patient [facility] Jewish, have a family history of breast and/or ovarian cancer: no Is there a family history of uterine cancer diagnosed at age < 73, gastrointestinal cancer, urinary tract cancer, family member who is a Personnel officer syndrome-associated carrier: no Is the patient overweight and hypertensive, family history of diabetes, personal history of gestational diabetes, preeclampsia or PCOS: no Is patient over 53, have PCOS,  family history of premature CHD under age 47, diabetes, smoke, have hypertension or peripheral artery disease:  no At any time, has a partner hit, kicked or otherwise hurt or frightened you?: no Over the past 2 weeks, have you felt down, depressed or hopeless?: no Over the past 2 weeks, have you felt little interest or pleasure in doing things?:no   Gynecologic History Patient's last menstrual period was 06/20/2018 (approximate). Contraception: OCP (estrogen/progesterone) Last Pap: n/a. Results were: n/a Last mammogram: n/a. Results were: n/a  Obstetric History OB History  Gravida Para Term Preterm AB Living  1 1 1     1   SAB TAB Ectopic Multiple Live Births        0 1    # Outcome Date GA Lbr Len/2nd Weight Sex Delivery Anes PTL Lv  1 Term 03/10/15 [redacted]w[redacted]d 12:38 / 00:43 7 lb 7.1 oz (3.375 kg) M Vag-Spont EPI  LIV    Past Medical History:  Diagnosis Date  . Medical history non-contributory     Past Surgical History:  Procedure Laterality Date  . NO PAST SURGERIES       Current Outpatient Medications:  .  Norethindrone Acetate-Ethinyl Estrad-FE (BLISOVI 24 FE) 1-20 MG-MCG(24) tablet, Take 1 tablet by mouth daily., Disp: 1 Package, Rfl: 11 .  metroNIDAZOLE (FLAGYL) 500 MG tablet, Take 1 tablet (500 mg total) by mouth 2 (two) times daily. (Patient not taking: Reported on 06/06/2018), Disp: 14 tablet, Rfl:  0 .  ondansetron (ZOFRAN) 4 MG tablet, Take 1 tablet (4 mg total) by mouth every 6 (six) hours. (Patient not taking: Reported on 07/20/2018), Disp: 12 tablet, Rfl: 0 No Known Allergies  Social History   Tobacco Use  . Smoking status: Never Smoker  . Smokeless tobacco: Never Used  Substance Use Topics  . Alcohol use: No    Family History  Problem Relation Age of Onset  . Hypertension Other   . Cancer Other       Review of Systems  Constitutional: negative for fatigue and weight loss Respiratory: negative for cough and wheezing Cardiovascular: negative for chest pain, fatigue and palpitations Gastrointestinal: negative for abdominal pain and change in bowel habits Musculoskeletal:negative for myalgias Neurological: negative for gait problems and tremors Behavioral/Psych: negative for abusive relationship, depression Endocrine: negative for temperature intolerance    Genitourinary:negative for abnormal menstrual periods, genital lesions, hot flashes, sexual problems and vaginal discharge Integument/breast: negative for breast lump, breast tenderness, nipple discharge and skin lesion(s)    Objective:       BP 110/68   Pulse 89   Ht 5\' 5"  (1.651 m)   Wt 203 lb (92.1 kg)   LMP 06/20/2018 (Approximate)   BMI 33.78 kg/m  General:   alert  Skin:   no rash or abnormalities  Lungs:   clear to auscultation bilaterally  Heart:   regular rate and rhythm, S1, S2 normal, no murmur, click, rub or  gallop  Breasts:   normal without suspicious masses, skin or nipple changes or axillary nodes  Abdomen:  normal findings: no organomegaly, soft, non-tender and no hernia  Pelvis:  External genitalia: normal general appearance Urinary system: urethral meatus normal and bladder without fullness, nontender Vaginal: normal without tenderness, induration or masses Cervix: normal appearance Adnexa: normal bimanual exam Uterus: anteverted and non-tender, normal size   Lab Review Urine  pregnancy test Labs reviewed yes Radiologic studies reviewed no  50% of 20 min visit spent on counseling and coordination of care.   Assessment:     1. Encounter for annual routine gynecological examination - doing well  2. Screening for cervical cancer Rx: - Cytology - PAP( [facility])  3. Vaginal discharge Rx: - Cervicovaginal ancillary only( [facility])  4. Routine screening for STI (sexually transmitted infection) Rx: - HIV Antibody (routine testing w rflx)  5. Encounter for surveillance of contraceptive pills Rx: - Norethindrone Acetate-Ethinyl Estrad-FE (BLISOVI 24 FE) 1-20 MG-MCG(24) tablet; Take 1 tablet by mouth daily.  Dispense: 1 Package; Refill: 11    Plan:    Education reviewed: calcium supplements, depression evaluation, low fat, low cholesterol diet, safe sex/STD prevention, self breast exams and weight bearing exercise. Contraception: OCP (estrogen/progesterone). Follow up in: 1 year.   Meds ordered this encounter  Medications  . Norethindrone Acetate-Ethinyl Estrad-FE (BLISOVI 24 FE) 1-20 MG-MCG(24) tablet    Sig: Take 1 tablet by mouth daily.    Dispense:  1 Package    Refill:  11   Orders Placed This Encounter  Procedures  . HIV Antibody (routine testing w rflx)    Brock Bad MD 07-20-2018

## 2018-07-20 NOTE — Progress Notes (Signed)
Patient presents for Annual Exam   LMP:06/20/18 Last pap:N/A due to age  Contraception: pills needs refill  STD Screening: Yes    CC:  None

## 2018-07-21 LAB — HIV ANTIBODY (ROUTINE TESTING W REFLEX): HIV Screen 4th Generation wRfx: NONREACTIVE

## 2018-07-22 LAB — CERVICOVAGINAL ANCILLARY ONLY
Chlamydia: NEGATIVE
Neisseria Gonorrhea: POSITIVE — AB

## 2018-07-24 ENCOUNTER — Other Ambulatory Visit: Payer: Self-pay | Admitting: Obstetrics

## 2018-07-24 DIAGNOSIS — A549 Gonococcal infection, unspecified: Secondary | ICD-10-CM

## 2018-07-24 LAB — CYTOLOGY - PAP: Diagnosis: NEGATIVE

## 2018-07-24 MED ORDER — AZITHROMYCIN 500 MG PO TABS: 1000.0000 mg | ORAL_TABLET | Freq: Once | ORAL | 0 refills | Status: AC

## 2018-07-24 MED ORDER — CEFTRIAXONE SODIUM 250 MG IJ SOLR: 250.0000 mg | Freq: Once | INTRAMUSCULAR | Status: DC

## 2018-07-25 ENCOUNTER — Ambulatory Visit: Payer: Medicaid Other | Admitting: Obstetrics

## 2018-07-26 ENCOUNTER — Other Ambulatory Visit: Payer: Self-pay

## 2018-07-26 ENCOUNTER — Ambulatory Visit (INDEPENDENT_AMBULATORY_CARE_PROVIDER_SITE_OTHER): Payer: Medicaid Other

## 2018-07-26 VITALS — BP 113/65 | HR 91 | Ht 65.0 in | Wt 208.8 lb

## 2018-07-26 DIAGNOSIS — A549 Gonococcal infection, unspecified: Secondary | ICD-10-CM

## 2018-07-26 MED ORDER — CEFTRIAXONE SODIUM 250 MG IJ SOLR
250.0000 mg | Freq: Once | INTRAMUSCULAR | Status: AC
  Administered 2018-07-26: 250 mg via INTRAMUSCULAR

## 2018-07-26 NOTE — Progress Notes (Addendum)
Presents for Rocephin Injection for +GC.  Rocephin given in RUOQ, tolerated well.   Patient advised to make sure that her partner is treated and that they should abstain from sexual intercourse until after they have both been checked for TOC.  Administrations This Visit    cefTRIAXone (ROCEPHIN) injection 250 mg    Admin Date 07/26/2018 Action Given Dose 250 mg Route Intramuscular Administered By Maretta Bees, RMA

## 2018-10-26 ENCOUNTER — Other Ambulatory Visit: Payer: Self-pay

## 2018-10-26 ENCOUNTER — Ambulatory Visit (INDEPENDENT_AMBULATORY_CARE_PROVIDER_SITE_OTHER): Payer: Medicaid Other

## 2018-10-26 ENCOUNTER — Other Ambulatory Visit (HOSPITAL_COMMUNITY)
Admission: RE | Admit: 2018-10-26 | Discharge: 2018-10-26 | Disposition: A | Discharge status: Home / Self Care | Payer: Medicaid Other | Source: Ambulatory Visit | Attending: Obstetrics | Admitting: Obstetrics

## 2018-10-26 VITALS — Ht 66.0 in | Wt 212.0 lb

## 2018-10-26 DIAGNOSIS — A549 Gonococcal infection, unspecified: Secondary | ICD-10-CM

## 2018-10-26 DIAGNOSIS — Z7689 Persons encountering health services in other specified circumstances: Secondary | ICD-10-CM

## 2018-10-26 NOTE — Progress Notes (Signed)
  SUBJECTIVE:  23 y.o. female presents for Test of Cure for Positive Gonorrhea. History of known exposure to STD.  Patient's last menstrual period was 10/22/2018 (exact date).  OBJECTIVE:  She appears well, afebrile. TOC done.  ASSESSMENT:  Treatment completed.  Partner vomited his pills up and they are having sex without using condom.  Partner needs to get Rx from his Provider.   PLAN:  GC probe sent to lab. Treatment: To be determined once lab results are received ROV prn if symptoms persist or worsen.

## 2018-10-27 LAB — CERVICOVAGINAL ANCILLARY ONLY
Chlamydia: NEGATIVE
Neisseria Gonorrhea: NEGATIVE

## 2019-01-03 ENCOUNTER — Other Ambulatory Visit: Payer: Self-pay

## 2019-01-03 ENCOUNTER — Ambulatory Visit (INDEPENDENT_AMBULATORY_CARE_PROVIDER_SITE_OTHER): Payer: Self-pay

## 2019-01-03 ENCOUNTER — Other Ambulatory Visit (HOSPITAL_COMMUNITY)
Admission: RE | Admit: 2019-01-03 | Discharge: 2019-01-03 | Disposition: A | Discharge status: Home / Self Care | Payer: Medicaid Other | Source: Ambulatory Visit | Attending: Obstetrics and Gynecology | Admitting: Obstetrics and Gynecology

## 2019-01-03 VITALS — BP 121/78 | HR 90

## 2019-01-03 DIAGNOSIS — N898 Other specified noninflammatory disorders of vagina: Secondary | ICD-10-CM

## 2019-01-03 NOTE — Progress Notes (Signed)
SUBJECTIVE:  23 y.o. female complains of vaginal discharge for a couple of days.  Denies abnormal vaginal bleeding or significant pelvic pain or fever. No UTI symptoms. Denies history of known exposure to STD.  No LMP recorded.  OBJECTIVE:  She appears well, afebrile. Urine dipstick: NONE   ASSESSMENT:  Vaginal Discharge  SMALL AMOUNT  Vaginal Odor SMALL AMOUNT    PLAN:  GC, chlamydia, probe sent to lab. Patient insurance will not cover BV or yeast.  Treatment: To be determined once lab results are received ROV prn if symptoms persist or worsen.

## 2019-01-04 LAB — CERVICOVAGINAL ANCILLARY ONLY
Chlamydia: NEGATIVE
Neisseria Gonorrhea: NEGATIVE

## 2019-01-11 ENCOUNTER — Other Ambulatory Visit: Payer: Self-pay

## 2019-01-11 DIAGNOSIS — N898 Other specified noninflammatory disorders of vagina: Secondary | ICD-10-CM

## 2019-01-11 MED ORDER — METRONIDAZOLE 500 MG PO TABS: 500.0000 mg | ORAL_TABLET | Freq: Two times a day (BID) | ORAL | 0 refills | Status: DC

## 2019-03-30 ENCOUNTER — Encounter (HOSPITAL_COMMUNITY): Payer: Self-pay | Admitting: Emergency Medicine

## 2019-03-30 ENCOUNTER — Emergency Department (HOSPITAL_COMMUNITY)
Admission: EM | Admit: 2019-03-30 | Discharge: 2019-03-30 | Disposition: A | Discharge status: Home / Self Care | Payer: Medicaid Other | Attending: Emergency Medicine | Admitting: Emergency Medicine

## 2019-03-30 ENCOUNTER — Other Ambulatory Visit: Payer: Self-pay

## 2019-03-30 DIAGNOSIS — N760 Acute vaginitis: Secondary | ICD-10-CM | POA: Insufficient documentation

## 2019-03-30 DIAGNOSIS — Z113 Encounter for screening for infections with a predominantly sexual mode of transmission: Secondary | ICD-10-CM | POA: Insufficient documentation

## 2019-03-30 DIAGNOSIS — Z79899 Other long term (current) drug therapy: Secondary | ICD-10-CM | POA: Insufficient documentation

## 2019-03-30 DIAGNOSIS — B9689 Other specified bacterial agents as the cause of diseases classified elsewhere: Secondary | ICD-10-CM

## 2019-03-30 LAB — URINALYSIS, ROUTINE W REFLEX MICROSCOPIC
Bilirubin Urine: NEGATIVE
Glucose, UA: NEGATIVE mg/dL
Hgb urine dipstick: NEGATIVE
Ketones, ur: NEGATIVE mg/dL
Leukocytes,Ua: NEGATIVE
Nitrite: NEGATIVE
Protein, ur: NEGATIVE mg/dL
Specific Gravity, Urine: 1.009 (ref 1.005–1.030)
pH: 8 (ref 5.0–8.0)

## 2019-03-30 LAB — WET PREP, GENITAL
Sperm: NONE SEEN
Trich, Wet Prep: NONE SEEN
Yeast Wet Prep HPF POC: NONE SEEN

## 2019-03-30 LAB — POC URINE PREG, ED: Preg Test, Ur: NEGATIVE

## 2019-03-30 LAB — HIV ANTIBODY (ROUTINE TESTING W REFLEX): HIV Screen 4th Generation wRfx: NONREACTIVE

## 2019-03-30 MED ORDER — LIDOCAINE HCL (PF) 1 % IJ SOLN
INTRAMUSCULAR | Status: AC
  Administered 2019-03-30: 14:00:00 1 mL
  Filled 2019-03-30: qty 5

## 2019-03-30 MED ORDER — CEFTRIAXONE SODIUM 250 MG IJ SOLR
250.0000 mg | Freq: Once | INTRAMUSCULAR | Status: AC
  Administered 2019-03-30: 14:00:00 250 mg via INTRAMUSCULAR
  Filled 2019-03-30: qty 250

## 2019-03-30 MED ORDER — AZITHROMYCIN 250 MG PO TABS
1000.0000 mg | ORAL_TABLET | Freq: Once | ORAL | Status: AC
  Administered 2019-03-30: 1000 mg via ORAL
  Filled 2019-03-30: qty 4

## 2019-03-30 MED ORDER — ONDANSETRON 4 MG PO TBDP
8.0000 mg | ORAL_TABLET | Freq: Once | ORAL | Status: AC
  Administered 2019-03-30: 14:00:00 8 mg via ORAL
  Filled 2019-03-30: qty 2

## 2019-03-30 MED ORDER — METRONIDAZOLE 500 MG PO TABS: 500.0000 mg | ORAL_TABLET | Freq: Two times a day (BID) | ORAL | 0 refills | Status: DC

## 2019-03-30 NOTE — ED Provider Notes (Signed)
[facility] EMERGENCY DEPARTMENT Provider Note   CSN: [id number] Arrival date & time: 03/30/19  1136     History   Chief Complaint Chief Complaint  Patient presents with  . Vaginal Discharge    HPI Nicole [facility] is a 23 y.o. female with no significant past medical history presents to emergency department today with chief complaint of vaginal discharge x4 days.  Patient describes the discharge as colorless and odorless.  Patient reports a history of bacterial vaginosis, she was treated by PCP for the same in 01/2019 however wet prep was not done because her insurance did not cover it.  She was treated based on her symptoms.   She denies any fever, chills, abdominal pain, nausea, vomiting, abnormal vaginal bleeding or significant pelvic pain.  Denies symptoms of a urinary tract infection.  Denies any history of known exposure to STD.  LMP was 03/13/2019.   Past Medical History:  Diagnosis Date  . Medical history non-contributory     Patient Active Problem List   Diagnosis Date Noted  . Normal labor and delivery 03/10/2015  . NVD (normal vaginal delivery) 03/10/2015    Past Surgical History:  Procedure Laterality Date  . NO PAST SURGERIES       OB History    Gravida  1   Para  1   Term  1   Preterm      AB      Living  1     SAB      TAB      Ectopic      Multiple  0   Live Births  1            Home Medications    Prior to Admission medications   Medication Sig Start Date End Date Taking? Authorizing Provider  metroNIDAZOLE (FLAGYL) 500 MG tablet Take 1 tablet (500 mg total) by mouth 2 (two) times daily. 01/11/19   Brock Bad, MD  Norethindrone Acetate-Ethinyl Estrad-FE (BLISOVI 24 FE) 1-20 MG-MCG(24) tablet Take 1 tablet by mouth daily. Patient not taking: Reported on 01/03/2019 07/20/18   Brock Bad, MD  ondansetron (ZOFRAN) 4 MG tablet Take 1 tablet (4 mg total) by mouth every 6 (six) hours. Patient not taking:  Reported on 07/20/2018 06/06/18   Wynetta Fines, MD    Family History Family History  Problem Relation Age of Onset  . Hypertension Other   . Cancer Other     Social History Social History   Tobacco Use  . Smoking status: Never Smoker  . Smokeless tobacco: Never Used  Substance Use Topics  . Alcohol use: No  . Drug use: No     Allergies   Patient has no known allergies.   Review of Systems Review of Systems All other systems are reviewed and are negative for acute change except as noted in the HPI.   Physical Exam Updated Vital Signs BP 108/72   Pulse 88   Temp 98.6 F (37 C) (Oral)   Resp 17   LMP 03/13/2019   SpO2 100%   Physical Exam Vitals signs and nursing note reviewed.  Constitutional:      Appearance: She is well-developed. She is not ill-appearing or toxic-appearing.  HENT:     Head: Normocephalic and atraumatic.     Nose: Nose normal.  Eyes:     General: No scleral icterus.       Right eye: No discharge.  Left eye: No discharge.     Conjunctiva/sclera: Conjunctivae normal.  Neck:     Musculoskeletal: Normal range of motion.     Vascular: No JVD.  Cardiovascular:     Rate and Rhythm: Normal rate and regular rhythm.     Pulses: Normal pulses.     Heart sounds: Normal heart sounds.  Pulmonary:     Effort: Pulmonary effort is normal.     Breath sounds: Normal breath sounds.  Abdominal:     General: There is no distension.     Palpations: Abdomen is soft.     Tenderness: There is no abdominal tenderness. There is no right CVA tenderness, left CVA tenderness, guarding or rebound.  Genitourinary:    Comments: Normal external genitalia. No pain with speculum insertion. Closed cervical os with normal appearance - no rash or lesions. Thin white vaginal discharge from cervix.  No bleeding from cervix or in vaginal vault. On bimanual examination no adnexal tenderness or cervical motion tenderness. Chaperone Sabino Gassericole RN present during exam.;  Musculoskeletal: Normal range of motion.  Skin:    General: Skin is warm and dry.  Neurological:     Mental Status: She is oriented to person, place, and time.     GCS: GCS eye subscore is 4. GCS verbal subscore is 5. GCS motor subscore is 6.     Comments: Fluent speech, no facial droop.  Psychiatric:        Behavior: Behavior normal.      ED Treatments / Results  Labs (all labs ordered are listed, but only abnormal results are displayed) Labs Reviewed  WET PREP, GENITAL - Abnormal; Notable for the following components:      Result Value   Clue Cells Wet Prep HPF POC PRESENT (*)    WBC, Wet Prep HPF POC MANY (*)    All other components within normal limits  URINALYSIS, ROUTINE W REFLEX MICROSCOPIC - Abnormal; Notable for the following components:   APPearance HAZY (*)    All other components within normal limits  RPR  HIV ANTIBODY (ROUTINE TESTING W REFLEX)  POC URINE PREG, ED  GC/CHLAMYDIA PROBE AMP ([facility]    EKG None  Radiology No results found.  Procedures Procedures (including critical care time)  Medications Ordered in ED Medications  cefTRIAXone (ROCEPHIN) injection 250 mg (has no administration in time range)  azithromycin (ZITHROMAX) tablet 1,000 mg (has no administration in time range)  ondansetron (ZOFRAN-ODT) disintegrating tablet 8 mg (has no administration in time range)     Initial Impression / Assessment and Plan / ED Course  I have reviewed the triage vital signs and the nursing notes.  Pertinent labs & imaging results that were available during my care of the patient were reviewed by me and considered in my medical decision making (see chart for details).   23 yo female presents with vaginal discharge. She is well appearing and in no acute distress.  Afebrile. She was noted to be tachycardic to 118 in triage.  On my exam tachycardia has resolved and has heart rate of 88. Abdomen is non tender, no peritoneal signs.  Thin white  vaginal discharge seen on pelvic exam. Pt understands that they have GC/Chlamydia cultures and labs for RPR and HIV pending and that they will need to inform all sexual partners if results return positive. Pt has been treated prophylacticly with azithromycin and rocephin due to pts history, pelvic exam, and wet prep with increased WBCs. Pt not concerning for  PID because hemodynamically stable and no cervical motion tenderness on pelvic exam.  UA without signs of infection.  Pregnancy test is negative.  Pt has also been treated with flagyl for Bacterial Vaginosis.  Pt has been advised to not drink alcohol while on this medication. Patient to be discharged with instructions to follow up with OBGYN/PCP. Discussed importance of using protection when sexually active.    Portions of this note were generated with [employer]. Dictation errors may occur despite best attempts at proofreading.   Final Clinical Impressions(s) / ED Diagnoses   Final diagnoses:  BV (bacterial vaginosis)    ED Discharge Orders    None       Flint Melter 03/30/19 1417    Lacretia Leigh, MD 03/31/19 1449

## 2019-03-30 NOTE — ED Notes (Signed)
Patient verbalizes understanding of discharge instructions. Opportunity for questioning and answers were provided. Armband removed by staff, pt discharged from ED.  

## 2019-03-30 NOTE — ED Triage Notes (Addendum)
Pt here for evaluation of vaginal discharge that began 4 days ago.  Pt states discharge is colorless and odorless.

## 2019-03-30 NOTE — Discharge Instructions (Addendum)
1. Medications: Flagyl has been sent to your pharmacy for bacterial vaginosis.  You cannot drink alcohol while taking this medication as it will make you violently ill.  2. Treatment: rest, drink plenty of fluids, use a condom with every sexual encounter  3. Follow Up: Please followup with your primary doctor in 3 days for discussion of your diagnoses and further evaluation after today's visit;   Please return to the ER for worsening symptoms, high fevers or persistent vomiting.  You have been tested for HIV, syphilis, chlamydia and gonorrhea.  These results will be available in approximately 3 days.  Please inform all sexual partners if you test positive for any of these diseases.

## 2019-03-31 LAB — RPR: RPR Ser Ql: NONREACTIVE

## 2019-04-02 LAB — GC/CHLAMYDIA PROBE AMP (~~LOC~~) NOT AT ARMC
Chlamydia: NEGATIVE
Neisseria Gonorrhea: NEGATIVE

## 2019-07-12 ENCOUNTER — Other Ambulatory Visit: Payer: Self-pay

## 2019-07-12 ENCOUNTER — Encounter (HOSPITAL_COMMUNITY): Payer: Self-pay | Admitting: *Deleted

## 2019-07-12 ENCOUNTER — Emergency Department (HOSPITAL_COMMUNITY)
Admission: EM | Admit: 2019-07-12 | Discharge: 2019-07-12 | Disposition: A | Discharge status: Home / Self Care | Payer: Medicaid Other | Attending: Emergency Medicine | Admitting: Emergency Medicine

## 2019-07-12 DIAGNOSIS — Z79899 Other long term (current) drug therapy: Secondary | ICD-10-CM | POA: Insufficient documentation

## 2019-07-12 DIAGNOSIS — B9689 Other specified bacterial agents as the cause of diseases classified elsewhere: Secondary | ICD-10-CM

## 2019-07-12 DIAGNOSIS — Z202 Contact with and (suspected) exposure to infections with a predominantly sexual mode of transmission: Secondary | ICD-10-CM

## 2019-07-12 DIAGNOSIS — N760 Acute vaginitis: Secondary | ICD-10-CM | POA: Insufficient documentation

## 2019-07-12 LAB — WET PREP, GENITAL
Sperm: NONE SEEN
Trich, Wet Prep: NONE SEEN
Yeast Wet Prep HPF POC: NONE SEEN

## 2019-07-12 LAB — HIV ANTIBODY (ROUTINE TESTING W REFLEX): HIV Screen 4th Generation wRfx: NONREACTIVE

## 2019-07-12 LAB — RPR: RPR Ser Ql: NONREACTIVE

## 2019-07-12 LAB — PREGNANCY, URINE: Preg Test, Ur: NEGATIVE

## 2019-07-12 MED ORDER — CEFTRIAXONE SODIUM 500 MG IJ SOLR
500.0000 mg | Freq: Once | INTRAMUSCULAR | Status: AC
  Administered 2019-07-12: 10:00:00 500 mg via INTRAMUSCULAR
  Filled 2019-07-12: qty 500

## 2019-07-12 MED ORDER — DOXYCYCLINE HYCLATE 100 MG PO CAPS: 100.0000 mg | ORAL_CAPSULE | Freq: Two times a day (BID) | ORAL | 0 refills | Status: AC

## 2019-07-12 MED ORDER — LIDOCAINE HCL (PF) 1 % IJ SOLN
INTRAMUSCULAR | Status: AC
  Administered 2019-07-12: 5 mL
  Filled 2019-07-12: qty 5

## 2019-07-12 NOTE — Discharge Instructions (Signed)
You have been prescribed doxycycline due to your STI exposure.  You need to take this medication twice a day for the next 7 days.  Please be sure to take this medication with food and to complete all the prescribed antibiotics.  Make sure to follow-up with your OB/GYN at your scheduled appointment on March 22.  At this appointment you can also discuss your diagnosis of bacterial vaginosis.

## 2019-07-12 NOTE — ED Triage Notes (Signed)
Pt states a partner told her they had an STD and she needed to be checked for the same. Pt reporting "normal" vaginal discharge, denies pain.

## 2019-07-12 NOTE — ED Provider Notes (Signed)
Patient seen in conjunction with L Joldersma, PA-C.   Brief, patient presenting due to concerns for STD.  She states her partner was diagnosed with nongonococcal urethritis and may have been positive for chlamydia.  She reports chronic vaginal discharge which has been slightly worse over the past several days.  No fevers, chills nausea vomiting, domino pain, urinary symptoms.  Pelvic exam showed minimal white discharge, no other abnormalities.  Wet prep shows clue cells and WBC. Due to possible exposure and worsening d/c, will tx for gl/cl and BV.   Exam, patient is nontoxic.  No abdominal tenderness.  I agree with plan for antibiotic treatment and discharge.    Alveria Apley, PA-C 07/12/19 1202    Tegeler, Canary Brim, MD 07/12/19 [phone]

## 2019-07-12 NOTE — ED Notes (Signed)
Rocephin given in left deltoid, tolerated well.

## 2019-07-12 NOTE — ED Provider Notes (Signed)
[facility] Provider Note   CSN: [id number] Arrival date & time: 07/12/19  1601     History Chief Complaint  Patient presents with  . Exposure to STD    [facility] is a 24 y.o. female.  HPI HPI Comments: [facility] is a 24 y.o. female with a history of BV who presents to the Emergency Department complaining of a possible STI.  She has a history of intermittent vaginal discharge which she states worsened a few days ago.  It is white and nonodorous.  She had unprotected sexual intercourse with a female partner 3 weeks ago who called her this morning and states that he was diagnosed with nongonococcal urethritis.  She presents today for STD check.  Besides her vaginal discharge, she denies any other symptoms at this time.  No hematuria, dysuria, difficulty urinating, vaginal bleeding, pelvic pain, abdominal pain, fevers, chills, chest pain, shortness of breath.     Past Medical History:  Diagnosis Date  . Medical history non-contributory     Patient Active Problem List   Diagnosis Date Noted  . Normal labor and delivery 03/10/2015  . NVD (normal vaginal delivery) 03/10/2015    Past Surgical History:  Procedure Laterality Date  . NO PAST SURGERIES       OB History    Gravida  1   Para  1   Term  1   Preterm      AB      Living  1     SAB      TAB      Ectopic      Multiple  0   Live Births  1           Family History  Problem Relation Age of Onset  . Hypertension Other   . Cancer Other     Social History   Tobacco Use  . Smoking status: Never Smoker  . Smokeless tobacco: Never Used  Substance Use Topics  . Alcohol use: No  . Drug use: No    Home Medications Prior to Admission medications   Medication Sig Start Date End Date Taking? Authorizing Provider  metroNIDAZOLE (FLAGYL) 500 MG tablet Take 1 tablet (500 mg total) by mouth 2 (two) times daily. 03/30/19   Albrizze, Kaitlyn E, PA-C    Norethindrone Acetate-Ethinyl Estrad-FE (BLISOVI 24 FE) 1-20 MG-MCG(24) tablet Take 1 tablet by mouth daily. Patient not taking: Reported on 01/03/2019 07/20/18   Brock Bad, MD  ondansetron (ZOFRAN) 4 MG tablet Take 1 tablet (4 mg total) by mouth every 6 (six) hours. Patient not taking: Reported on 07/20/2018 06/06/18   Wynetta Fines, MD    Allergies    Patient has no known allergies.  Review of Systems   Review of Systems  Constitutional: Negative for chills.  Cardiovascular: Negative for chest pain and leg swelling.  Gastrointestinal: Negative for abdominal pain, diarrhea, nausea and vomiting.  Genitourinary: Positive for vaginal discharge (Chronic; worsened the past 3 days). Negative for difficulty urinating, dysuria, hematuria, pelvic pain, vaginal bleeding and vaginal pain.  Neurological: Negative for dizziness and numbness.  All other systems reviewed and are negative.  Physical Exam Updated Vital Signs BP (!) 130/91   Pulse 92   Temp 98 F (36.7 C)   Resp 17   LMP 07/01/2019   SpO2 100%   Physical Exam Vitals and nursing note reviewed.  Constitutional:      General: She is not in  acute distress.    Appearance: Normal appearance. She is normal weight. She is not ill-appearing, toxic-appearing or diaphoretic.     Comments: Well-developed pleasant African-American female.  Sitting upright speaking clearly and coherently.  HENT:     Head: Normocephalic and atraumatic.     Right Ear: External ear normal.     Left Ear: External ear normal.     Nose: Nose normal.     Mouth/Throat:     Mouth: Mucous membranes are moist.     Pharynx: Oropharynx is clear.  Eyes:     Extraocular Movements: Extraocular movements intact.     Pupils: Pupils are equal, round, and reactive to light.  Cardiovascular:     Rate and Rhythm: Normal rate and regular rhythm.     Pulses: Normal pulses.     Heart sounds: Normal heart sounds. No murmur. No friction rub. No gallop.   Pulmonary:      Effort: Pulmonary effort is normal. No respiratory distress.     Breath sounds: Normal breath sounds. No stridor. No wheezing, rhonchi or rales.  Abdominal:     General: Abdomen is flat.     Tenderness: There is no abdominal tenderness.     Comments: No abdominal tenderness appreciated in all 4 quadrants.  No suprapubic pain.  Genitourinary:    General: Normal vulva.     Vagina: No vaginal discharge.     Comments: Female nursing chaperone present.  Pelvic exam performed.  Normal-appearing vulva.  No erythema, lesions, edema.  Normal-appearing vaginal mucosa.  Cervical os visualized and is closed.  Small amount of nonodorous white discharge noted in the vaginal vault.  No friability.  No erythema.  No cervical motion tenderness. Musculoskeletal:        General: Normal range of motion.     Cervical back: Normal range of motion.  Skin:    General: Skin is warm and dry.     Capillary Refill: Capillary refill takes less than 2 seconds.  Neurological:     General: No focal deficit present.     Mental Status: She is oriented to person, place, and time.  Psychiatric:        Mood and Affect: Mood normal.        Behavior: Behavior normal.    ED Results / Procedures / Treatments   Labs (all labs ordered are listed, but only abnormal results are displayed) Labs Reviewed  POC URINE PREG, ED   EKG None  Radiology No results found.  Procedures Procedures (including critical care time)  Medications Ordered in ED Medications - No data to display  ED Course  I have reviewed the triage vital signs and the nursing notes.  Pertinent labs & imaging results that were available during my care of the patient were reviewed by me and considered in my medical decision making (see chart for details).    MDM Rules/Calculators/A&P                      8:31 AM patient is a 24 year old African-American female that presents with 3 days of worsening vaginal discharge.  She states she had  unprotected sexual intercourse with a female partner 3 weeks ago who then called her this morning noting that he was just diagnosed with nongonococcal urethritis.  She presents today for STD check.  Physical exam is reassuring.  No abdominal tenderness.  No suprapubic pain.  Will perform pelvic exam.   8:55 AM pelvic exam performed and patient tolerated  the procedure well.  Will discuss results of wet prep once available.   10:04 AM wet prep was positive for clue cells and urine pregnancy test is negative.  Patient states she has an appointment with her OB/GYN in a week.  We will treat her empirically for GC/chlamydia and will have her follow-up with her OB/GYN for her BV.  Discussed this with patient and she agrees with the plan.  Her questions were answered.  Strict return precautions were given.  She understands to return with any new or worsening symptoms.  Vital signs stable the time of discharge.   Final Clinical Impression(s) / ED Diagnoses Final diagnoses:  STD exposure  Bacterial vaginosis    Rx / DC Orders ED Discharge Orders         Ordered    doxycycline (VIBRAMYCIN) 100 MG capsule  2 times daily     07/12/19 1008           Placido Sou, PA-C 07/12/19 1016    Tegeler, Canary Brim, MD 07/12/19 1527

## 2019-07-13 LAB — GC/CHLAMYDIA PROBE AMP (~~LOC~~) NOT AT ARMC
Chlamydia: POSITIVE — AB
Neisseria Gonorrhea: NEGATIVE

## 2019-07-23 ENCOUNTER — Ambulatory Visit: Payer: Medicaid Other | Admitting: Obstetrics

## 2019-07-25 ENCOUNTER — Ambulatory Visit (INDEPENDENT_AMBULATORY_CARE_PROVIDER_SITE_OTHER): Payer: Medicaid Other | Admitting: Obstetrics

## 2019-07-25 ENCOUNTER — Other Ambulatory Visit: Payer: Self-pay

## 2019-07-25 ENCOUNTER — Encounter: Payer: Self-pay | Admitting: Obstetrics

## 2019-07-25 ENCOUNTER — Other Ambulatory Visit (HOSPITAL_COMMUNITY)
Admission: RE | Admit: 2019-07-25 | Discharge: 2019-07-25 | Disposition: A | Discharge status: Home / Self Care | Payer: Medicaid Other | Source: Ambulatory Visit | Attending: Obstetrics | Admitting: Obstetrics

## 2019-07-25 VITALS — BP 113/67 | HR 93 | Wt 215.0 lb

## 2019-07-25 DIAGNOSIS — Z3041 Encounter for surveillance of contraceptive pills: Secondary | ICD-10-CM | POA: Diagnosis not present

## 2019-07-25 DIAGNOSIS — B9689 Other specified bacterial agents as the cause of diseases classified elsewhere: Secondary | ICD-10-CM

## 2019-07-25 DIAGNOSIS — N898 Other specified noninflammatory disorders of vagina: Secondary | ICD-10-CM

## 2019-07-25 DIAGNOSIS — A549 Gonococcal infection, unspecified: Secondary | ICD-10-CM | POA: Diagnosis not present

## 2019-07-25 DIAGNOSIS — N760 Acute vaginitis: Secondary | ICD-10-CM

## 2019-07-25 MED ORDER — METRONIDAZOLE 0.75 % VA GEL: 1.0000 | Freq: Two times a day (BID) | VAGINAL | 2 refills | Status: DC

## 2019-07-25 NOTE — Progress Notes (Signed)
Patient ID: Nicole Woodard, female   DOB: Feb 09, 1996, 23 y.o.   MRN: [id number]  Chief Complaint  Patient presents with  . Gynecologic Exam    HPI Nicole Woodard is a 24 y.o. female.  Presents for follow up after being treated for GC infection.  She says that she was also told that she has BV but was not treated. HPI  Past Medical History:  Diagnosis Date  . Medical history non-contributory     Past Surgical History:  Procedure Laterality Date  . NO PAST SURGERIES      Family History  Problem Relation Age of Onset  . Hypertension Other   . Cancer Other     Social History Social History   Tobacco Use  . Smoking status: Never Smoker  . Smokeless tobacco: Never Used  Substance Use Topics  . Alcohol use: No  . Drug use: No    No Known Allergies  Current Outpatient Medications  Medication Sig Dispense Refill  . metroNIDAZOLE (FLAGYL) 500 MG tablet Take 1 tablet (500 mg total) by mouth 2 (two) times daily. (Patient not taking: Reported on 07/12/2019) 14 tablet 0  . metroNIDAZOLE (METROGEL VAGINAL) 0.75 % vaginal gel Place 1 Applicatorful vaginally 2 (two) times daily. 70 g 2  . Norethindrone Acetate-Ethinyl Estrad-FE (BLISOVI 24 FE) 1-20 MG-MCG(24) tablet Take 1 tablet by mouth daily. (Patient not taking: Reported on 01/03/2019) 1 Package 11  . ondansetron (ZOFRAN) 4 MG tablet Take 1 tablet (4 mg total) by mouth every 6 (six) hours. (Patient not taking: Reported on 07/20/2018) 12 tablet 0   Current Facility-Administered Medications  Medication Dose Route Frequency Provider Last Rate Last Admin  . cefTRIAXone (ROCEPHIN) injection 250 mg  250 mg Intramuscular Once Shelly Bombard, MD        Review of Systems Review of Systems Constitutional: negative for fatigue and weight loss Respiratory: negative for cough and wheezing Cardiovascular: negative for chest pain, fatigue and palpitations Gastrointestinal: negative for abdominal pain and change in bowel  habits Genitourinary:negative Integument/breast: negative for nipple discharge Musculoskeletal:negative for myalgias Neurological: negative for gait problems and tremors Behavioral/Psych: negative for abusive relationship, depression Endocrine: negative for temperature intolerance      Blood pressure 113/67, pulse 93, weight 215 lb (97.5 kg), last menstrual period 07/24/2019.  Physical Exam Physical Exam General:   alert and no distress  Skin:   no rash or abnormalities  Lungs:   clear to auscultation bilaterally  Heart:   regular rate and rhythm, S1, S2 normal, no murmur, click, rub or gallop  Breasts:   normal without suspicious masses, skin or nipple changes or axillary nodes  Abdomen:  normal findings: no organomegaly, soft, non-tender and no hernia          Pelvic:  Deferred due to heavy menstration                       Self swab done for GC/CT  50% of 15 min visit spent on counseling and coordination of care.   Data Reviewed Wet Prep and probes  Assessment       1. Vaginal discharge  2. GC (gonococcus infection), treated Rx: - Cervicovaginal ancillary only  3. Encounter for surveillance of contraceptive pills  4. BV (bacterial vaginosis) Rx: - metroNIDAZOLE (METROGEL VAGINAL) 0.75 % vaginal gel; Place 1 Applicatorful vaginally 2 (two) times daily.  Dispense: 70 g; Refill: 2   Plan   Follow up in 3 months for Annual /  Pap  No orders of the defined types were placed in this encounter.  Meds ordered this encounter  Medications  . metroNIDAZOLE (METROGEL VAGINAL) 0.75 % vaginal gel    Sig: Place 1 Applicatorful vaginally 2 (two) times daily.    Dispense:  70 g    Refill:  2    Brock Bad, MD 07/25/2019 1:35 PM

## 2019-07-26 LAB — CERVICOVAGINAL ANCILLARY ONLY
Chlamydia: NEGATIVE
Comment: NEGATIVE
Comment: NORMAL
Neisseria Gonorrhea: NEGATIVE

## 2019-08-23 ENCOUNTER — Ambulatory Visit: Payer: Medicaid Other | Admitting: Obstetrics

## 2019-10-02 ENCOUNTER — Ambulatory Visit: Payer: Medicaid Other | Admitting: Obstetrics

## 2019-10-25 ENCOUNTER — Ambulatory Visit (INDEPENDENT_AMBULATORY_CARE_PROVIDER_SITE_OTHER): Payer: Medicaid Other | Admitting: Obstetrics

## 2019-10-25 ENCOUNTER — Other Ambulatory Visit: Payer: Self-pay

## 2019-10-25 ENCOUNTER — Other Ambulatory Visit (HOSPITAL_COMMUNITY)
Admission: RE | Admit: 2019-10-25 | Discharge: 2019-10-25 | Disposition: A | Discharge status: Home / Self Care | Payer: Medicaid Other | Source: Ambulatory Visit | Attending: Obstetrics | Admitting: Obstetrics

## 2019-10-25 ENCOUNTER — Encounter: Payer: Self-pay | Admitting: Obstetrics

## 2019-10-25 VITALS — BP 116/80 | HR 78 | Ht 65.0 in | Wt 221.0 lb

## 2019-10-25 DIAGNOSIS — N898 Other specified noninflammatory disorders of vagina: Secondary | ICD-10-CM | POA: Diagnosis present

## 2019-10-25 DIAGNOSIS — E669 Obesity, unspecified: Secondary | ICD-10-CM

## 2019-10-25 DIAGNOSIS — Z01419 Encounter for gynecological examination (general) (routine) without abnormal findings: Secondary | ICD-10-CM

## 2019-10-25 DIAGNOSIS — Z3041 Encounter for surveillance of contraceptive pills: Secondary | ICD-10-CM

## 2019-10-25 DIAGNOSIS — Z113 Encounter for screening for infections with a predominantly sexual mode of transmission: Secondary | ICD-10-CM | POA: Diagnosis not present

## 2019-10-25 MED ORDER — BLISOVI 24 FE 1-20 MG-MCG(24) PO TABS: 1.0000 | ORAL_TABLET | Freq: Every day | ORAL | 11 refills | Status: DC

## 2019-10-25 NOTE — Progress Notes (Signed)
Pt is in the office for annual.  Last pap 07-20-18 Currently taking BC pills. LMP 10-21-19 -Informed pt about FP medicaid coverage, pt still desires all -std testing. GAD-7= 1

## 2019-10-25 NOTE — Progress Notes (Signed)
Subjective:        Nicole Woodard is a 24 y.o. female here for a routine exam.  Current complaints: None.    Personal health questionnaire:  Is patient [name], have a family history of breast and/or ovarian cancer: no Is there a family history of uterine cancer diagnosed at age < 25, gastrointestinal cancer, urinary tract cancer, family member who is a Personnel officer syndrome-associated carrier: no Is the patient overweight and hypertensive, family history of diabetes, personal history of gestational diabetes, preeclampsia or PCOS: yes Is patient over 37, have PCOS,  family history of premature CHD under age 56, diabetes, smoke, have hypertension or peripheral artery disease:  no At any time, has a partner hit, kicked or otherwise hurt or frightened you?: no Over the past 2 weeks, have you felt down, depressed or hopeless?: no Over the past 2 weeks, have you felt little interest or pleasure in doing things?:no   Gynecologic History Patient's last menstrual period was 10/21/2019. Contraception: OCP (estrogen/progesterone) Last Pap: 07-20-2018. Results were: normal Last mammogram: n/a. Results were: n/a  Obstetric History OB History  Gravida Para Term Preterm AB Living  1 1 1     1   SAB TAB Ectopic Multiple Live Births        0 1    # Outcome Date GA Lbr Len/2nd Weight Sex Delivery Anes PTL Lv  1 Term 03/10/15 [redacted]w[redacted]d 12:38 / 00:43 7 lb 7.1 oz (3.375 kg) M Vag-Spont EPI  LIV    Past Medical History:  Diagnosis Date  . Medical history non-contributory     Past Surgical History:  Procedure Laterality Date  . NO PAST SURGERIES       Current Outpatient Medications:  .  metroNIDAZOLE (FLAGYL) 500 MG tablet, Take 1 tablet (500 mg total) by mouth 2 (two) times daily. (Patient not taking: Reported on 07/12/2019), Disp: 14 tablet, Rfl: 0 .  metroNIDAZOLE (METROGEL VAGINAL) 0.75 % vaginal gel, Place 1 Applicatorful vaginally 2 (two) times daily., Disp: 70 g, Rfl: 2 .   Norethindrone Acetate-Ethinyl Estrad-FE (BLISOVI 24 FE) 1-20 MG-MCG(24) tablet, Take 1 tablet by mouth daily., Disp: 28 tablet, Rfl: 11 .  ondansetron (ZOFRAN) 4 MG tablet, Take 1 tablet (4 mg total) by mouth every 6 (six) hours. (Patient not taking: Reported on 07/20/2018), Disp: 12 tablet, Rfl: 0  Current Facility-Administered Medications:  .  cefTRIAXone (ROCEPHIN) injection 250 mg, 250 mg, Intramuscular, Once, 07/22/2018, MD No Known Allergies  Social History   Tobacco Use  . Smoking status: Never Smoker  . Smokeless tobacco: Never Used  Substance Use Topics  . Alcohol use: No    Family History  Problem Relation Age of Onset  . Hypertension Other   . Cancer Other       Review of Systems  Constitutional: negative for fatigue and weight loss Respiratory: negative for cough and wheezing Cardiovascular: negative for chest pain, fatigue and palpitations Gastrointestinal: negative for abdominal pain and change in bowel habits Musculoskeletal:negative for myalgias Neurological: negative for gait problems and tremors Behavioral/Psych: negative for abusive relationship, depression Endocrine: negative for temperature intolerance    Genitourinary:negative for abnormal menstrual periods, genital lesions, hot flashes, sexual problems and vaginal discharge Integument/breast: negative for breast lump, breast tenderness, nipple discharge and skin lesion(s)    Objective:       BP 116/80   Pulse 78   Ht 5\' 5"  (1.651 m)   Wt 221 lb (100.2 kg)   LMP 10/21/2019  BMI 36.78 kg/m  General:   alert  Skin:   no rash or abnormalities  Lungs:   clear to auscultation bilaterally  Heart:   regular rate and rhythm, S1, S2 normal, no murmur, click, rub or gallop  Breasts:   normal without suspicious masses, skin or nipple changes or axillary nodes  Abdomen:  normal findings: no organomegaly, soft, non-tender and no hernia  Pelvis:  External genitalia: normal general appearance Urinary  system: urethral meatus normal and bladder without fullness, nontender Vaginal: normal without tenderness, induration or masses Cervix: normal appearance Adnexa: normal bimanual exam Uterus: anteverted and non-tender, normal size   Lab Review Urine pregnancy test Labs reviewed yes Radiologic studies reviewed no  50% of 20 min visit spent on counseling and coordination of care.   Assessment:     1. Encounter for routine gynecological examination with Papanicolaou smear of cervix Rx: - Cytology - PAP( [facility])  2. Vaginal discharge Rx: - Cervicovaginal ancillary only( [facility])  3. Screen for STD (sexually transmitted disease) Rx: - HIV antibody (with reflex) - RPR - Hepatitis B Surface AntiGEN - Hepatitis C Antibody  4. Encounter for surveillance of contraceptive pills Rx: - Norethindrone Acetate-Ethinyl Estrad-FE (BLISOVI 24 FE) 1-20 MG-MCG(24) tablet; Take 1 tablet by mouth daily.  Dispense: 28 tablet; Refill: 11  5. Obesity (BMI 35.0-39.9 without comorbidity) - program of caloric reduction, exercise and behavioral modification recommended    Plan:    Education reviewed: calcium supplements, depression evaluation, low fat, low cholesterol diet, safe sex/STD prevention, self breast exams and weight bearing exercise. Contraception: OCP (estrogen/progesterone). Follow up in: 1 year.   Meds ordered this encounter  Medications  . Norethindrone Acetate-Ethinyl Estrad-FE (BLISOVI 24 FE) 1-20 MG-MCG(24) tablet    Sig: Take 1 tablet by mouth daily.    Dispense:  28 tablet    Refill:  11   Orders Placed This Encounter  Procedures  . HIV antibody (with reflex)  . RPR  . Hepatitis B Surface AntiGEN  . Hepatitis C Antibody    Shelly Bombard, MD 10/25/2019 11:21 AM

## 2019-10-26 LAB — CERVICOVAGINAL ANCILLARY ONLY
Bacterial Vaginitis (gardnerella): POSITIVE — AB
Candida Glabrata: NEGATIVE
Candida Vaginitis: NEGATIVE
Chlamydia: POSITIVE — AB
Comment: NEGATIVE
Comment: NEGATIVE
Comment: NEGATIVE
Comment: NEGATIVE
Comment: NEGATIVE
Comment: NORMAL
Neisseria Gonorrhea: NEGATIVE
Trichomonas: NEGATIVE

## 2019-10-26 LAB — CYTOLOGY - PAP
Comment: NEGATIVE
Diagnosis: NEGATIVE
High risk HPV: NEGATIVE

## 2019-10-26 LAB — RPR: RPR Ser Ql: NONREACTIVE

## 2019-10-26 LAB — HEPATITIS B SURFACE ANTIGEN: Hepatitis B Surface Ag: NEGATIVE

## 2019-10-26 LAB — HIV ANTIBODY (ROUTINE TESTING W REFLEX): HIV Screen 4th Generation wRfx: NONREACTIVE

## 2019-10-26 LAB — HEPATITIS C ANTIBODY: Hep C Virus Ab: 7.8 s/co ratio — ABNORMAL HIGH (ref 0.0–0.9)

## 2019-10-28 ENCOUNTER — Other Ambulatory Visit: Payer: Self-pay | Admitting: Obstetrics

## 2019-10-28 DIAGNOSIS — A749 Chlamydial infection, unspecified: Secondary | ICD-10-CM

## 2019-10-28 DIAGNOSIS — B9689 Other specified bacterial agents as the cause of diseases classified elsewhere: Secondary | ICD-10-CM

## 2019-10-28 MED ORDER — DOXYCYCLINE HYCLATE 100 MG PO TABS: 100.0000 mg | ORAL_TABLET | Freq: Two times a day (BID) | ORAL | 0 refills | Status: DC

## 2019-10-28 MED ORDER — TINIDAZOLE 500 MG PO TABS: 1000.0000 mg | ORAL_TABLET | Freq: Every day | ORAL | 2 refills | Status: DC

## 2019-10-29 ENCOUNTER — Other Ambulatory Visit: Payer: Self-pay

## 2019-10-29 ENCOUNTER — Telehealth: Payer: Self-pay

## 2019-10-29 DIAGNOSIS — B1920 Unspecified viral hepatitis C without hepatic coma: Secondary | ICD-10-CM

## 2019-10-29 NOTE — Telephone Encounter (Signed)
Pt called back and was made aware of results and referral.  Pt voiced understanding.

## 2019-10-30 DIAGNOSIS — N760 Acute vaginitis: Secondary | ICD-10-CM

## 2019-10-31 MED ORDER — METRONIDAZOLE 0.75 % VA GEL: 1.0000 | Freq: Every day | VAGINAL | 1 refills | Status: DC

## 2019-11-22 ENCOUNTER — Ambulatory Visit (INDEPENDENT_AMBULATORY_CARE_PROVIDER_SITE_OTHER): Payer: Self-pay | Admitting: Family

## 2019-11-22 ENCOUNTER — Other Ambulatory Visit: Payer: Self-pay

## 2019-11-22 ENCOUNTER — Telehealth: Payer: Self-pay | Admitting: Pharmacy Technician

## 2019-11-22 ENCOUNTER — Encounter: Payer: Self-pay | Admitting: Family

## 2019-11-22 VITALS — BP 107/72 | HR 94 | Temp 98.3°F | Wt 222.0 lb

## 2019-11-22 DIAGNOSIS — R768 Other specified abnormal immunological findings in serum: Secondary | ICD-10-CM

## 2019-11-22 NOTE — Progress Notes (Signed)
Subjective:    Patient ID: Nicole Woodard, female    DOB: [birth year], 24 y.o.   MRN: [id number]  Chief Complaint  Patient presents with  . New Patient (Initial Visit)    Hep C    HPI:  Nicole Woodard is a 24 y.o. female with no significant previous medical history presenting today for initial evaluation and treatment of Hepatitis C.   Ms. Batley was recently seen in her GYN/OB office for routine care and noted to have a positive Hepatitis C antibody test. Does have one tattoo. No previous history of IV drug use, blood transfusions prior to 1992, sharing of toothbrushes/razors, or sexual contact with known positive partners. No personal or family history of liver disease. Denies any current symptoms including abdominal pain, nausea, vomiting, scleral icterus, jaundice, or fatigue.  Has not received treatment to date.  No current recreational or illicit drug use, tobacco use, and occasional alcohol consumption..  No Known Allergies    Outpatient Medications Prior to Visit  Medication Sig Dispense Refill  . Norethindrone Acetate-Ethinyl Estrad-FE (BLISOVI 24 FE) 1-20 MG-MCG(24) tablet Take 1 tablet by mouth daily. 28 tablet 11  . metroNIDAZOLE (METROGEL VAGINAL) 0.75 % vaginal gel Place 1 Applicatorful vaginally 2 (two) times daily. 70 g 2  . ondansetron (ZOFRAN) 4 MG tablet Take 1 tablet (4 mg total) by mouth every 6 (six) hours. 12 tablet 0  . doxycycline (VIBRA-TABS) 100 MG tablet Take 1 tablet (100 mg total) by mouth 2 (two) times daily. (Patient not taking: Reported on 11/22/2019) 14 tablet 0  . metroNIDAZOLE (FLAGYL) 500 MG tablet Take 1 tablet (500 mg total) by mouth 2 (two) times daily. (Patient not taking: Reported on 07/12/2019) 14 tablet 0  . metroNIDAZOLE (METROGEL) 0.75 % vaginal gel Place 1 Applicatorful vaginally at bedtime. Apply one applicatorful to vagina at bedtime for 5 days (Patient not taking: Reported on 11/22/2019) 70 g 1  . tinidazole (TINDAMAX) 500 MG tablet  Take 2 tablets (1,000 mg total) by mouth daily with breakfast. (Patient not taking: Reported on 11/22/2019) 10 tablet 2   Facility-Administered Medications Prior to Visit  Medication Dose Route Frequency Provider Last Rate Last Admin  . cefTRIAXone (ROCEPHIN) injection 250 mg  250 mg Intramuscular Once Brock Bad, MD         Past Medical History:  Diagnosis Date  . Medical history non-contributory       Past Surgical History:  Procedure Laterality Date  . NO PAST SURGERIES        Family History  Problem Relation Age of Onset  . Hypertension Other   . Cancer Other       Social History   Socioeconomic History  . Marital status: Single    Spouse name: Not on file  . Number of children: Not on file  . Years of education: Not on file  . Highest education level: Not on file  Occupational History  . Not on file  Tobacco Use  . Smoking status: Never Smoker  . Smokeless tobacco: Never Used  Vaping Use  . Vaping Use: Never used  Substance and Sexual Activity  . Alcohol use: No    Comment: occ  . Drug use: No  . Sexual activity: Yes    Partners: Male    Birth control/protection: OCP, Pill  Other Topics Concern  . Not on file  Social History Narrative  . Not on file   Social Determinants of Health   Financial Resource Strain:   .  Difficulty of Paying Living Expenses:   Food Insecurity:   . Worried About Programme researcher, broadcasting/film/video in the Last Year:   . Barista in the Last Year:   Transportation Needs:   . Freight forwarder (Medical):   Marland Kitchen Lack of Transportation (Non-Medical):   Physical Activity:   . Days of Exercise per Week:   . Minutes of Exercise per Session:   Stress:   . Feeling of Stress :   Social Connections:   . Frequency of Communication with Friends and Family:   . Frequency of Social Gatherings with Friends and Family:   . Attends Religious Services:   . Active Member of Clubs or Organizations:   . Attends Banker  Meetings:   Marland Kitchen Marital Status:   Intimate Partner Violence:   . Fear of Current or Ex-Partner:   . Emotionally Abused:   Marland Kitchen Physically Abused:   . Sexually Abused:       Review of Systems  Constitutional: Negative for chills, fatigue, fever and unexpected weight change.  Respiratory: Negative for cough, chest tightness, shortness of breath and wheezing.   Cardiovascular: Negative for chest pain and leg swelling.  Gastrointestinal: Negative for abdominal distention, constipation, diarrhea, nausea and vomiting.  Neurological: Negative for dizziness, weakness, light-headedness and headaches.  Hematological: Does not bruise/bleed easily.       Objective:    BP 107/72   Pulse 94   Temp 98.3 F (36.8 C) (Oral)   Wt (!) 222 lb (100.7 kg)   BMI 36.94 kg/m  Nursing note and vital signs reviewed.  Physical Exam Constitutional:      General: She is not in acute distress.    Appearance: She is well-developed.  Cardiovascular:     Rate and Rhythm: Normal rate and regular rhythm.     Heart sounds: Normal heart sounds. No murmur heard.  No friction rub. No gallop.   Pulmonary:     Effort: Pulmonary effort is normal. No respiratory distress.     Breath sounds: Normal breath sounds. No wheezing or rales.  Chest:     Chest wall: No tenderness.  Abdominal:     General: Bowel sounds are normal. There is no distension.     Palpations: Abdomen is soft. There is no mass.     Tenderness: There is no abdominal tenderness. There is no guarding or rebound.  Skin:    General: Skin is warm and dry.  Neurological:     Mental Status: She is alert and oriented to person, place, and time.  Psychiatric:        Behavior: Behavior normal.        Thought Content: Thought content normal.        Judgment: Judgment normal.        Assessment & Plan:   Patient Active Problem List   Diagnosis Date Noted  . Positive hepatitis C antibody test 11/22/2019  . Normal labor and delivery 03/10/2015  .  NVD (normal vaginal delivery) 03/10/2015     Problem List Items Addressed This Visit      Other   Positive hepatitis C antibody test - Primary    Ms. Goodridge is a 24 year old female with positive hepatitis C antibody testing found during routine screening.  Risk factors include tattoo.  She is treatment nave and asymptomatic.  No history of liver disease.  Discussed the pathogenesis, transmission, prevention, risks if left untreated, and treatment options for hepatitis C.  Check hepatitis C  status today.  Previous testing negative for HIV and hepatitis B.  Plan for treatment if necessary pending blood work results.      Relevant Orders   Hepatic function panel   Hepatitis C genotype   Hepatitis C RNA quantitative   Liver Fibrosis, FibroTest-ActiTest   CBC       I have discontinued DQQIWLN N. Baisch's ondansetron, metroNIDAZOLE, metroNIDAZOLE, doxycycline, tinidazole, and metroNIDAZOLE. I am also having her maintain her Blisovi 24 Fe. We will continue to administer cefTRIAXone.   Follow-up: Return Pending blood work results and starting medication.    Marcos Eke, MSN, FNP-C Nurse Practitioner [facility] Main number: [phone]

## 2019-11-22 NOTE — Patient Instructions (Signed)
Nice to meet you.   We will check your lab work today.   Limit acetaminophen (Tylenol) usage to no more than 2 grams (2,000 mg) per day.  Avoid alcohol.  Do not share toothbrushes or razors.  Practice safe sex to protect against transmission as well as sexually transmitted disease.    Hepatitis C Hepatitis C is a viral infection of the liver. It can lead to scarring of the liver (cirrhosis), liver failure, or liver cancer. Hepatitis C may go undetected for months or years because people with the infection may not have symptoms, or they may have only mild symptoms. What are the causes? This condition is caused by the hepatitis C virus (HCV). The virus can spread from person to person (is contagious) through: Blood. Childbirth. A woman who has hepatitis C can pass it to her baby during birth. Bodily fluids, such as breast milk, tears, semen, vaginal fluids, and saliva. Blood transfusions or organ transplants done in the [place] before 1992.  What increases the risk? The following factors may make you more likely to develop this condition: Having contact with unclean (contaminated) needles or syringes. This may result from: Acupuncture. Tattoing. Body piercing. Injecting drugs. Having unprotected sex with someone who is infected. Needing treatment to filter your blood (kidney dialysis). Having HIV (human immunodeficiency virus) or AIDS (acquired immunodeficiency syndrome). Working in a job that involves contact with blood or bodily fluids, such as health care.  What are the signs or symptoms? Symptoms of this condition include: Fatigue. Loss of appetite. Nausea. Vomiting. Abdominal pain. Dark yellow urine. Yellowish skin and eyes (jaundice). Itchy skin. Clay-colored bowel movements. Joint pain. Bleeding and bruising easily. Fluid building up in your stomach (ascites).  In some cases, you may not have any symptoms. How is this diagnosed? This condition is  diagnosed with: Blood tests. Other tests to check how well your liver is functioning. They may include: Magnetic resonance elastography (MRE). This imaging test uses MRIs and sound waves to measure liver stiffness. Transient elastography. This imaging test uses ultrasounds to measure liver stiffness. Liver biopsy. This test requires taking a small tissue sample from your liver to examine it under a microscope.  How is this treated? Your health care provider may perform noninvasive tests or a liver biopsy to help decide the best course of treatment. Treatment may include: Antiviral medicines and other medicines. Follow-up treatments every 6-12 months for infections or other liver conditions. Receiving a donated liver (liver transplant).  Follow these instructions at home: Medicines Take over-the-counter and prescription medicines only as told by your health care provider. Take your antiviral medicine as told by your health care provider. Do not stop taking the antiviral even if you start to feel better. Do not take any medicines unless approved by your health care provider, including over-the-counter medicines and birth control pills. Activity Rest as needed. Do not have sex unless approved by your health care provider. Ask your health care provider when you may return to school or work. Eating and drinking Eat a balanced diet with plenty of fruits and vegetables, whole grains, and lowfat (lean) meats or non-meat proteins (such as beans or tofu). Drink enough fluids to keep your urine clear or pale yellow. Do not drink alcohol. General instructions Do not share toothbrushes, nail clippers, or razors. Wash your hands frequently with soap and water. If soap and water are not available, use hand sanitizer. Cover any cuts or open sores on your skin to prevent spreading the virus.   Keep all follow-up visits as told by your health care provider. This is important. You may need follow-up visits  every 6-12 months. How is this prevented? There is no vaccine for hepatitis C. The only way to prevent the disease is to reduce the risk of exposure to the virus. Make sure you: Wash your hands frequently with soap and water. If soap and water are not available, use hand sanitizer. Do not share needles or syringes. Practice safe sex and use condoms. Avoid handling blood or bodily fluids without gloves or other protection. Avoid getting tattoos or piercings in shops or other locations that are not clean.  Contact a health care provider if: You have a fever. You develop abdominal pain. You pass dark urine. You pass clay-colored stools. You develop joint pain. Get help right away if: You have increasing fatigue or weakness. You lose your appetite. You cannot eat or drink without vomiting. You develop jaundice or your jaundice gets worse. You bruise or bleed easily. Summary Hepatitis C is a viral infection of the liver. It can lead to scarring of the liver (cirrhosis), liver failure, or liver cancer. The hepatitis C virus (HCV) causes this condition. The virus can pass from person to person (is contagious). You should not take any medicines unless approved by your health care provider. This includes over-the-counter medicines and birth control pills. This information is not intended to replace advice given to you by your health care provider. Make sure you discuss any questions you have with your health care provider. Document Released: 04/16/2000 Document Revised: 05/25/2016 Document Reviewed: 05/25/2016 Elsevier Interactive Patient Education  2018 Elsevier Inc.  

## 2019-11-22 NOTE — Assessment & Plan Note (Signed)
Nicole Woodard is a 24 year old female with positive hepatitis C antibody testing found during routine screening.  Risk factors include tattoo.  She is treatment nave and asymptomatic.  No history of liver disease.  Discussed the pathogenesis, transmission, prevention, risks if left untreated, and treatment options for hepatitis C.  Check hepatitis C status today.  Previous testing negative for HIV and hepatitis B.  Plan for treatment if necessary pending blood work results.

## 2019-11-22 NOTE — Telephone Encounter (Signed)
[facility] Patient Advocate Encounter  Got needed information and signatures on applications.  Will hold until medication is prescribed.  Netty Starring. Dimas Aguas CPhT Specialty Pharmacy Patient [facility] Phone: [phone] Fax:  [phone]

## 2019-11-22 NOTE — Telephone Encounter (Signed)
[facility] Patient Product/process development scientist completed.    The patient is uninsured (Family planning Medicaid will not cover needed medication) and will need patient assistance for medication.  We can complete the application and will need to meet with the patient for signatures and income documentation.  Nicole Woodard. Dimas Aguas CPhT Specialty Pharmacy Patient [facility] Phone: [phone] Fax:  [phone]

## 2019-11-28 LAB — HEPATIC FUNCTION PANEL
AG Ratio: 1.3 (calc) (ref 1.0–2.5)
ALT: 7 U/L (ref 6–29)
AST: 12 U/L (ref 10–30)
Albumin: 4.2 g/dL (ref 3.6–5.1)
Alkaline phosphatase (APISO): 72 U/L (ref 31–125)
Bilirubin, Direct: 0.1 mg/dL (ref 0.0–0.2)
Globulin: 3.3 g/dL (calc) (ref 1.9–3.7)
Indirect Bilirubin: 0.2 mg/dL (calc) (ref 0.2–1.2)
Total Bilirubin: 0.3 mg/dL (ref 0.2–1.2)
Total Protein: 7.5 g/dL (ref 6.1–8.1)

## 2019-11-28 LAB — CBC
HCT: 40.2 % (ref 35.0–45.0)
Hemoglobin: 12.9 g/dL (ref 11.7–15.5)
MCH: 26.4 pg — ABNORMAL LOW (ref 27.0–33.0)
MCHC: 32.1 g/dL (ref 32.0–36.0)
MCV: 82.2 fL (ref 80.0–100.0)
MPV: 10.3 fL (ref 7.5–12.5)
Platelets: 345 10*3/uL (ref 140–400)
RBC: 4.89 10*6/uL (ref 3.80–5.10)
RDW: 13.6 % (ref 11.0–15.0)
WBC: 8.1 10*3/uL (ref 3.8–10.8)

## 2019-11-28 LAB — LIVER FIBROSIS, FIBROTEST-ACTITEST
ALT: 9 U/L (ref 6–29)
Alpha-2-Macroglobulin: 206 mg/dL (ref 106–279)
Apolipoprotein A1: 159 mg/dL (ref 101–198)
Bilirubin: 0.2 mg/dL (ref 0.2–1.2)
Fibrosis Score: 0.02
GGT: 23 U/L (ref 3–40)
Haptoglobin: 339 mg/dL — ABNORMAL HIGH (ref 43–212)
Necroinflammat ACT Score: 0.01
Reference ID: 3487526

## 2019-11-28 LAB — HEPATITIS C RNA QUANTITATIVE
HCV Quantitative Log: <1.18 Log IU/mL
HCV RNA, PCR, QN: <15 IU/mL

## 2019-11-28 LAB — HEPATITIS C GENOTYPE: HCV Genotype: NOT DETECTED

## 2019-12-05 ENCOUNTER — Ambulatory Visit (INDEPENDENT_AMBULATORY_CARE_PROVIDER_SITE_OTHER): Payer: Self-pay | Admitting: Obstetrics

## 2019-12-05 ENCOUNTER — Encounter: Payer: Self-pay | Admitting: Obstetrics

## 2019-12-05 ENCOUNTER — Other Ambulatory Visit: Payer: Self-pay

## 2019-12-05 ENCOUNTER — Other Ambulatory Visit (HOSPITAL_COMMUNITY)
Admission: RE | Admit: 2019-12-05 | Discharge: 2019-12-05 | Disposition: A | Discharge status: Home / Self Care | Payer: Medicaid Other | Source: Ambulatory Visit | Attending: Obstetrics | Admitting: Obstetrics

## 2019-12-05 VITALS — BP 118/69 | HR 91 | Wt 218.2 lb

## 2019-12-05 DIAGNOSIS — B9689 Other specified bacterial agents as the cause of diseases classified elsewhere: Secondary | ICD-10-CM

## 2019-12-05 DIAGNOSIS — N898 Other specified noninflammatory disorders of vagina: Secondary | ICD-10-CM | POA: Insufficient documentation

## 2019-12-05 DIAGNOSIS — N760 Acute vaginitis: Secondary | ICD-10-CM

## 2019-12-05 MED ORDER — METRONIDAZOLE 500 MG PO TABS: 500.0000 mg | ORAL_TABLET | Freq: Two times a day (BID) | ORAL | 2 refills | Status: DC

## 2019-12-05 NOTE — Progress Notes (Signed)
Pt. States she is still having vaginal irritation, denies irregular bleeding or pain. Pt. States she is having white discharge.

## 2019-12-05 NOTE — Progress Notes (Signed)
Patient ID: Nicole Woodard, female   DOB: [birth year], 24 y.o.   MRN: [id number]  Chief Complaint  Patient presents with  . Vaginitis    HPI Nicole Woodard is a 24 y.o. female.  Complains of vaginal discharge with irritation and odor. HPI  Past Medical History:  Diagnosis Date  . Medical history non-contributory     Past Surgical History:  Procedure Laterality Date  . NO PAST SURGERIES      Family History  Problem Relation Age of Onset  . Hypertension Other   . Cancer Other     Social History Social History   Tobacco Use  . Smoking status: Never Smoker  . Smokeless tobacco: Never Used  Vaping Use  . Vaping Use: Never used  Substance Use Topics  . Alcohol use: No    Comment: occ  . Drug use: No    No Known Allergies  Current Outpatient Medications  Medication Sig Dispense Refill  . Norethindrone Acetate-Ethinyl Estrad-FE (BLISOVI 24 FE) 1-20 MG-MCG(24) tablet Take 1 tablet by mouth daily. 28 tablet 11  . metroNIDAZOLE (FLAGYL) 500 MG tablet Take 1 tablet (500 mg total) by mouth 2 (two) times daily. 14 tablet 2   Current Facility-Administered Medications  Medication Dose Route Frequency Provider Last Rate Last Admin  . cefTRIAXone (ROCEPHIN) injection 250 mg  250 mg Intramuscular Once Brock Bad, MD        Review of Systems Review of Systems Constitutional: negative for fatigue and weight loss Respiratory: negative for cough and wheezing Cardiovascular: negative for chest pain, fatigue and palpitations Gastrointestinal: negative for abdominal pain and change in bowel habits Genitourinary: positive for vaginal discharge and irritation Integument/breast: negative for nipple discharge Musculoskeletal:negative for myalgias Neurological: negative for gait problems and tremors Behavioral/Psych: negative for abusive relationship, depression Endocrine: negative for temperature intolerance      Blood pressure 118/69, pulse 91, weight 218 lb 3.2 oz (99  kg), last menstrual period 11/04/2019.  Physical Exam Physical Exam           General: Alert and no distress Abdomen:  normal findings: no organomegaly, soft, non-tender and no hernia  Pelvis:  External genitalia: normal general appearance Urinary system: urethral meatus normal and bladder without fullness, nontender Vaginal: normal without tenderness, induration or masses Cervix: normal appearance Adnexa: normal bimanual exam Uterus: anteverted and non-tender, normal size    50% of 15 min visit spent on counseling and coordination of care.   Data Reviewed Wet Prep Cultures  Assessment     1. Vaginal discharge Rx: - Cervicovaginal ancillary only  2. Vaginal irritation Rx: - Cervicovaginal ancillary only  3. BV (bacterial vaginosis) Rx: - metroNIDAZOLE (FLAGYL) 500 MG tablet; Take 1 tablet (500 mg total) by mouth 2 (two) times daily.  Dispense: 14 tablet; Refill: 2    Plan   Follow up prn  Meds ordered this encounter  Medications  . metroNIDAZOLE (FLAGYL) 500 MG tablet    Sig: Take 1 tablet (500 mg total) by mouth 2 (two) times daily.    Dispense:  14 tablet    Refill:  2    Need to obtain previous records  Brock Bad, MD 12/05/2019 10:29 AM

## 2019-12-06 ENCOUNTER — Other Ambulatory Visit: Payer: Self-pay | Admitting: Obstetrics

## 2019-12-06 DIAGNOSIS — B379 Candidiasis, unspecified: Secondary | ICD-10-CM

## 2019-12-06 LAB — CERVICOVAGINAL ANCILLARY ONLY
Bacterial Vaginitis (gardnerella): POSITIVE — AB
Candida Glabrata: NEGATIVE
Candida Vaginitis: POSITIVE — AB
Comment: NEGATIVE
Comment: NEGATIVE
Comment: NEGATIVE
Comment: NEGATIVE
Trichomonas: NEGATIVE

## 2019-12-06 MED ORDER — FLUCONAZOLE 150 MG PO TABS: 150.0000 mg | ORAL_TABLET | Freq: Once | ORAL | 0 refills | Status: DC

## 2019-12-25 ENCOUNTER — Other Ambulatory Visit: Payer: Self-pay | Admitting: Obstetrics

## 2019-12-25 DIAGNOSIS — Z3041 Encounter for surveillance of contraceptive pills: Secondary | ICD-10-CM

## 2020-01-04 ENCOUNTER — Other Ambulatory Visit: Payer: Self-pay

## 2020-01-04 ENCOUNTER — Encounter: Payer: Self-pay | Admitting: Obstetrics

## 2020-01-04 ENCOUNTER — Other Ambulatory Visit (HOSPITAL_COMMUNITY)
Admission: RE | Admit: 2020-01-04 | Discharge: 2020-01-04 | Disposition: A | Discharge status: Home / Self Care | Payer: Medicaid Other | Source: Ambulatory Visit | Attending: Obstetrics | Admitting: Obstetrics

## 2020-01-04 ENCOUNTER — Ambulatory Visit (INDEPENDENT_AMBULATORY_CARE_PROVIDER_SITE_OTHER): Payer: Self-pay | Admitting: Obstetrics

## 2020-01-04 VITALS — BP 128/79 | HR 82 | Ht 66.0 in | Wt 218.0 lb

## 2020-01-04 DIAGNOSIS — N760 Acute vaginitis: Secondary | ICD-10-CM

## 2020-01-04 DIAGNOSIS — N898 Other specified noninflammatory disorders of vagina: Secondary | ICD-10-CM | POA: Diagnosis present

## 2020-01-04 DIAGNOSIS — E569 Vitamin deficiency, unspecified: Secondary | ICD-10-CM

## 2020-01-04 DIAGNOSIS — B9689 Other specified bacterial agents as the cause of diseases classified elsewhere: Secondary | ICD-10-CM

## 2020-01-04 MED ORDER — FLUCONAZOLE 200 MG PO TABS: 200.0000 mg | ORAL_TABLET | ORAL | 2 refills | Status: DC

## 2020-01-04 MED ORDER — CITRANATAL RX 27-1 MG PO TABS: 1.0000 | ORAL_TABLET | Freq: Every day | ORAL | 11 refills | Status: DC

## 2020-01-04 MED ORDER — METRONIDAZOLE 500 MG PO TABS: 500.0000 mg | ORAL_TABLET | Freq: Two times a day (BID) | ORAL | 2 refills | Status: DC

## 2020-01-04 NOTE — Progress Notes (Signed)
Patient ID: Nicole Woodard, female   DOB: 03-17-1996, 24 y.o.   MRN: [id number]  Chief Complaint  Patient presents with  . Follow-up    TOC for CT     HPI Nicole Woodard is a 24 y.o. female.  Complains of vaginal discharge with slight odor but no irritation.  Has history of positive chlamydia, treated.  Needs TOC. HPI  Past Medical History:  Diagnosis Date  . Medical history non-contributory     Past Surgical History:  Procedure Laterality Date  . NO PAST SURGERIES      Family History  Problem Relation Age of Onset  . Hypertension Other   . Cancer Other     Social History Social History   Tobacco Use  . Smoking status: Never Smoker  . Smokeless tobacco: Never Used  Vaping Use  . Vaping Use: Never used  Substance Use Topics  . Alcohol use: No    Comment: occ  . Drug use: No    No Known Allergies  Current Outpatient Medications  Medication Sig Dispense Refill  . JUNEL FE 24 1-20 MG-MCG(24) tablet TAKE 1 TABLET BY MOUTH DAILY 28 tablet 11  . fluconazole (DIFLUCAN) 200 MG tablet Take 1 tablet (200 mg total) by mouth every 3 (three) days. 3 tablet 2  . metroNIDAZOLE (FLAGYL) 500 MG tablet Take 1 tablet (500 mg total) by mouth 2 (two) times daily. 14 tablet 2  . Prenat w/o A-FeCb-FeGl-DSS-FA (CITRANATAL RX) 27-1 MG TABS Take 1 tablet by mouth daily before breakfast. 30 tablet 11   Current Facility-Administered Medications  Medication Dose Route Frequency Provider Last Rate Last Admin  . cefTRIAXone (ROCEPHIN) injection 250 mg  250 mg Intramuscular Once Brock Bad, MD        Review of Systems Review of Systems Constitutional: negative for fatigue and weight loss Respiratory: negative for cough and wheezing Cardiovascular: negative for chest pain, fatigue and palpitations Gastrointestinal: negative for abdominal pain and change in bowel habits Genitourinary:positive for vaginal discharge Integument/breast: negative for nipple  discharge Musculoskeletal:negative for myalgias Neurological: negative for gait problems and tremors Behavioral/Psych: negative for abusive relationship, depression Endocrine: negative for temperature intolerance      Blood pressure 128/79, pulse 82, height 5\' 6"  (1.676 m), weight 218 lb (98.9 kg), last menstrual period 12/16/2019.  Physical Exam Physical Exam           General: Alert and no distress Abdomen:  normal findings: no organomegaly, soft, non-tender and no hernia  Pelvis:  External genitalia: normal general appearance Urinary system: urethral meatus normal and bladder without fullness, nontender Vaginal: normal without tenderness, induration or masses Cervix: normal appearance Adnexa: normal bimanual exam Uterus: anteverted and non-tender, normal size    50% of 15 min visit spent on counseling and coordination of care.   Data Reviewed Wet Prep Cultures  Assessment     1. Vaginal discharge Rx: - Cervicovaginal ancillary only( [place]) - fluconazole (DIFLUCAN) 200 MG tablet; Take 1 tablet (200 mg total) by mouth every 3 (three) days.  Dispense: 3 tablet; Refill: 2  2. BV (bacterial vaginosis) Rx: - metroNIDAZOLE (FLAGYL) 500 MG tablet; Take 1 tablet (500 mg total) by mouth 2 (two) times daily.  Dispense: 14 tablet; Refill: 2  3. Vitamin deficiency Rx: - Prenat w/o A-FeCb-FeGl-DSS-FA (CITRANATAL RX) 27-1 MG TABS; Take 1 tablet by mouth daily before breakfast.  Dispense: 30 tablet; Refill: 11    Plan   Follow up prn   Meds ordered this encounter  Medications  . fluconazole (DIFLUCAN) 200 MG tablet    Sig: Take 1 tablet (200 mg total) by mouth every 3 (three) days.    Dispense:  3 tablet    Refill:  2  . metroNIDAZOLE (FLAGYL) 500 MG tablet    Sig: Take 1 tablet (500 mg total) by mouth 2 (two) times daily.    Dispense:  14 tablet    Refill:  2  . Prenat w/o A-FeCb-FeGl-DSS-FA (CITRANATAL RX) 27-1 MG TABS    Sig: Take 1 tablet by mouth daily before  breakfast.    Dispense:  30 tablet    Refill:  11    Brock Bad, MD 01/04/2020 9:52 AM

## 2020-01-04 NOTE — Progress Notes (Signed)
RGYN patient presents for vaginal swab/TOC .  LMP: 12/1519  VC:[facility]

## 2020-01-08 ENCOUNTER — Other Ambulatory Visit: Payer: Self-pay | Admitting: Obstetrics

## 2020-01-08 LAB — CERVICOVAGINAL ANCILLARY ONLY
Bacterial Vaginitis (gardnerella): POSITIVE — AB
Candida Glabrata: NEGATIVE
Candida Vaginitis: POSITIVE — AB
Chlamydia: NEGATIVE
Comment: NEGATIVE
Comment: NEGATIVE
Comment: NEGATIVE
Comment: NEGATIVE
Comment: NEGATIVE
Comment: NORMAL
Neisseria Gonorrhea: NEGATIVE
Trichomonas: NEGATIVE

## 2020-02-13 ENCOUNTER — Encounter: Payer: Self-pay | Admitting: Obstetrics

## 2020-02-13 ENCOUNTER — Ambulatory Visit (INDEPENDENT_AMBULATORY_CARE_PROVIDER_SITE_OTHER): Payer: Self-pay | Admitting: Obstetrics

## 2020-02-13 ENCOUNTER — Other Ambulatory Visit: Payer: Self-pay

## 2020-02-13 ENCOUNTER — Other Ambulatory Visit (HOSPITAL_COMMUNITY)
Admission: RE | Admit: 2020-02-13 | Discharge: 2020-02-13 | Disposition: A | Discharge status: Home / Self Care | Payer: Medicaid Other | Source: Ambulatory Visit | Attending: Obstetrics | Admitting: Obstetrics

## 2020-02-13 VITALS — BP 111/76 | HR 96 | Ht 66.0 in | Wt 228.1 lb

## 2020-02-13 DIAGNOSIS — N898 Other specified noninflammatory disorders of vagina: Secondary | ICD-10-CM

## 2020-02-13 DIAGNOSIS — B373 Candidiasis of vulva and vagina: Secondary | ICD-10-CM

## 2020-02-13 DIAGNOSIS — B3731 Acute candidiasis of vulva and vagina: Secondary | ICD-10-CM

## 2020-02-13 DIAGNOSIS — N760 Acute vaginitis: Secondary | ICD-10-CM

## 2020-02-13 DIAGNOSIS — B9689 Other specified bacterial agents as the cause of diseases classified elsewhere: Secondary | ICD-10-CM

## 2020-02-13 MED ORDER — METRONIDAZOLE 500 MG PO TABS: 500.0000 mg | ORAL_TABLET | Freq: Two times a day (BID) | ORAL | 2 refills | Status: DC

## 2020-02-13 MED ORDER — FLUCONAZOLE 150 MG PO TABS: 150.0000 mg | ORAL_TABLET | Freq: Once | ORAL | 0 refills | Status: DC

## 2020-02-13 NOTE — Progress Notes (Signed)
Pt is in the office for vaginal irritation and discharge. Last pap 10-25-19 Pt has [place] med[place], advised that she may be billed for certain testing today, pt agreed.

## 2020-02-13 NOTE — Progress Notes (Signed)
Patient ID: Nicole Woodard, female   DOB: 11/12/1995, 24 y.o.   MRN: [id number]  Chief Complaint  Patient presents with  . GYN    HPI Nicole Woodard is a 24 y.o. female.  Complains of malodorous vaginal discharge with irritation. HPI  Past Medical History:  Diagnosis Date  . Medical history non-contributory     Past Surgical History:  Procedure Laterality Date  . NO PAST SURGERIES      Family History  Problem Relation Age of Onset  . Hypertension Other   . Cancer Other     Social History Social History   Tobacco Use  . Smoking status: Never Smoker  . Smokeless tobacco: Never Used  Vaping Use  . Vaping Use: Never used  Substance Use Topics  . Alcohol use: No    Comment: occ  . Drug use: No    No Known Allergies  Current Outpatient Medications  Medication Sig Dispense Refill  . JUNEL FE 24 1-20 MG-MCG(24) tablet TAKE 1 TABLET BY MOUTH DAILY 28 tablet 11  . fluconazole (DIFLUCAN) 150 MG tablet Take 1 tablet (150 mg total) by mouth once for 1 dose. 1 tablet 0  . fluconazole (DIFLUCAN) 200 MG tablet Take 1 tablet (200 mg total) by mouth every 3 (three) days. (Patient not taking: Reported on 02/13/2020) 3 tablet 2  . metroNIDAZOLE (FLAGYL) 500 MG tablet Take 1 tablet (500 mg total) by mouth 2 (two) times daily. (Patient not taking: Reported on 02/13/2020) 14 tablet 2  . metroNIDAZOLE (FLAGYL) 500 MG tablet Take 1 tablet (500 mg total) by mouth 2 (two) times daily. 14 tablet 2  . Prenat w/o A-FeCb-FeGl-DSS-FA (CITRANATAL RX) 27-1 MG TABS Take 1 tablet by mouth daily before breakfast. (Patient not taking: Reported on 02/13/2020) 30 tablet 11   Current Facility-Administered Medications  Medication Dose Route Frequency Provider Last Rate Last Admin  . cefTRIAXone (ROCEPHIN) injection 250 mg  250 mg Intramuscular Once Brock Bad, MD        Review of Systems Review of Systems Constitutional: negative for fatigue and weight loss Respiratory: negative for  cough and wheezing Cardiovascular: negative for chest pain, fatigue and palpitations Gastrointestinal: negative for abdominal pain and change in bowel habits Genitourinary:positive for malodorous vaginal discharge with irritation Integument/breast: negative for nipple discharge Musculoskeletal:negative for myalgias Neurological: negative for gait problems and tremors Behavioral/Psych: negative for abusive relationship, depression Endocrine: negative for temperature intolerance      Blood pressure 111/76, pulse 96, height 5\' 6"  (1.676 m), weight 228 lb 1.6 oz (103.5 kg), last menstrual period 02/05/2020.  Physical Exam Physical Exam           General:  Alert and no distress Abdomen:  normal findings: no organomegaly, soft, non-tender and no hernia  Pelvis:  External genitalia: normal general appearance Urinary system: urethral meatus normal and bladder without fullness, nontender Vaginal: normal without tenderness, induration or masses Cervix: normal appearance Adnexa: normal bimanual exam Uterus: anteverted and non-tender, normal size    50% of 15 min visit spent on counseling and coordination of care.   Data Reviewed Wet Prep Cultures  Assessment     1. Vaginal discharge Rx: - Cervicovaginal ancillary only( [place])  2. BV (bacterial vaginosis) Rx: - metroNIDAZOLE (FLAGYL) 500 MG tablet; Take 1 tablet (500 mg total) by mouth 2 (two) times daily.  Dispense: 14 tablet; Refill: 2  3. Candida vaginitis Rx: - fluconazole (DIFLUCAN) 150 MG tablet; Take 1 tablet (150 mg total) by mouth  once for 1 dose.  Dispense: 1 tablet; Refill: 0    Plan  Follow up prn    Meds ordered this encounter  Medications  . metroNIDAZOLE (FLAGYL) 500 MG tablet    Sig: Take 1 tablet (500 mg total) by mouth 2 (two) times daily.    Dispense:  14 tablet    Refill:  2  . fluconazole (DIFLUCAN) 150 MG tablet    Sig: Take 1 tablet (150 mg total) by mouth once for 1 dose.    Dispense:  1  tablet    Refill:  0     Brock Bad, MD 02/13/2020 9:50 AM

## 2020-02-14 ENCOUNTER — Other Ambulatory Visit: Payer: Self-pay | Admitting: Obstetrics

## 2020-02-14 DIAGNOSIS — N898 Other specified noninflammatory disorders of vagina: Secondary | ICD-10-CM

## 2020-02-14 LAB — CERVICOVAGINAL ANCILLARY ONLY
Bacterial Vaginitis (gardnerella): POSITIVE — AB
Candida Glabrata: NEGATIVE
Candida Vaginitis: POSITIVE — AB
Chlamydia: NEGATIVE
Comment: NEGATIVE
Comment: NEGATIVE
Comment: NEGATIVE
Comment: NEGATIVE
Comment: NEGATIVE
Comment: NORMAL
Neisseria Gonorrhea: NEGATIVE
Trichomonas: NEGATIVE

## 2020-02-14 MED ORDER — FLUCONAZOLE 200 MG PO TABS: 200.0000 mg | ORAL_TABLET | ORAL | 2 refills | Status: DC

## 2020-02-19 ENCOUNTER — Other Ambulatory Visit: Payer: Self-pay | Admitting: Obstetrics

## 2020-02-19 DIAGNOSIS — B373 Candidiasis of vulva and vagina: Secondary | ICD-10-CM

## 2020-02-19 DIAGNOSIS — B3731 Acute candidiasis of vulva and vagina: Secondary | ICD-10-CM

## 2020-03-26 ENCOUNTER — Encounter: Payer: Self-pay | Admitting: Obstetrics

## 2020-03-26 ENCOUNTER — Other Ambulatory Visit (HOSPITAL_COMMUNITY)
Admission: RE | Admit: 2020-03-26 | Discharge: 2020-03-26 | Disposition: A | Discharge status: Home / Self Care | Payer: Medicaid Other | Source: Ambulatory Visit | Attending: Obstetrics | Admitting: Obstetrics

## 2020-03-26 ENCOUNTER — Other Ambulatory Visit: Payer: Self-pay

## 2020-03-26 ENCOUNTER — Ambulatory Visit (INDEPENDENT_AMBULATORY_CARE_PROVIDER_SITE_OTHER): Payer: Self-pay | Admitting: Obstetrics

## 2020-03-26 VITALS — BP 112/68 | Wt 232.9 lb

## 2020-03-26 DIAGNOSIS — N898 Other specified noninflammatory disorders of vagina: Secondary | ICD-10-CM | POA: Diagnosis not present

## 2020-03-26 DIAGNOSIS — N760 Acute vaginitis: Secondary | ICD-10-CM

## 2020-03-26 DIAGNOSIS — B9689 Other specified bacterial agents as the cause of diseases classified elsewhere: Secondary | ICD-10-CM

## 2020-03-26 MED ORDER — METRONIDAZOLE 500 MG PO TABS: 500.0000 mg | ORAL_TABLET | Freq: Two times a day (BID) | ORAL | 2 refills | Status: DC

## 2020-03-26 NOTE — Progress Notes (Signed)
Patient ID: [name], Nicole Woodard   DOB: October 11, 1995, 24 y.o.   MRN: [id number]  Chief Complaint  Patient presents with  . Vaginitis    HPI DEWEY Woodard is a 24 y.o. Nicole Woodard.  Complains of malodorous vaginal discharge after intercourse. HPI  Past Medical History:  Diagnosis Date  . Medical history non-contributory     Past Surgical History:  Procedure Laterality Date  . NO PAST SURGERIES      Family History  Problem Relation Age of Onset  . Hypertension Other   . Cancer Other     Social History Social History   Tobacco Use  . Smoking status: Never Smoker  . Smokeless tobacco: Never Used  Vaping Use  . Vaping Use: Never used  Substance Use Topics  . Alcohol use: No    Comment: occ  . Drug use: No    No Known Allergies  Current Outpatient Medications  Medication Sig Dispense Refill  . JUNEL FE 24 1-20 MG-MCG(24) tablet TAKE 1 TABLET BY MOUTH DAILY 28 tablet 11  . fluconazole (DIFLUCAN) 200 MG tablet Take 1 tablet (200 mg total) by mouth every 3 (three) days. (Patient not taking: Reported on 03/26/2020) 3 tablet 2  . metroNIDAZOLE (FLAGYL) 500 MG tablet Take 1 tablet (500 mg total) by mouth 2 (two) times daily. (Patient not taking: Reported on 03/26/2020) 14 tablet 2  . metroNIDAZOLE (FLAGYL) 500 MG tablet Take 1 tablet (500 mg total) by mouth 2 (two) times daily. 14 tablet 2  . Prenat w/o A-FeCb-FeGl-DSS-FA (CITRANATAL RX) 27-1 MG TABS Take 1 tablet by mouth daily before breakfast. (Patient not taking: Reported on 02/13/2020) 30 tablet 11   Current Facility-Administered Medications  Medication Dose Route Frequency Provider Last Rate Last Admin  . cefTRIAXone (ROCEPHIN) injection 250 mg  250 mg Intramuscular Once Brock Bad, MD        Review of Systems Review of Systems Constitutional: negative for fatigue and weight loss Respiratory: negative for cough and wheezing Cardiovascular: negative for chest pain, fatigue and  palpitations Gastrointestinal: negative for abdominal pain and change in bowel habits Genitourinary: positive for malodorous vaginal discharge Integument/breast: negative for nipple discharge Musculoskeletal:negative for myalgias Neurological: negative for gait problems and tremors Behavioral/Psych: negative for abusive relationship, depression Endocrine: negative for temperature intolerance      Blood pressure 112/68, weight 232 lb 14.4 oz (105.6 kg).  Physical Exam Physical Exam           General: Alert and no distress Abdomen:  normal findings: no organomegaly, soft, non-tender and no hernia  Pelvis:  External genitalia: normal general appearance Urinary system: urethral meatus normal and bladder without fullness, nontender Vaginal: normal without tenderness, induration or masses Cervix: normal appearance Adnexa: normal bimanual exam Uterus: anteverted and non-tender, normal size    50% of 15 min visit spent on counseling and coordination of care.   Data Reviewed Wet prep and cultures  Assessment     1. Vaginal discharge Rx: - Cervicovaginal ancillary only  2. BV (bacterial vaginosis) Rx: - metroNIDAZOLE (FLAGYL) 500 MG tablet; Take 1 tablet (500 mg total) by mouth 2 (two) times daily.  Dispense: 14 tablet; Refill: 2    Plan  Follow up prn   Meds ordered this encounter  Medications  . metroNIDAZOLE (FLAGYL) 500 MG tablet    Sig: Take 1 tablet (500 mg total) by mouth 2 (two) times daily.    Dispense:  14 tablet    Refill:  2  Brock Bad, MD 03/26/2020 11:58 AM

## 2020-03-28 ENCOUNTER — Other Ambulatory Visit: Payer: Self-pay | Admitting: Obstetrics

## 2020-03-28 LAB — CERVICOVAGINAL ANCILLARY ONLY
Bacterial Vaginitis (gardnerella): POSITIVE — AB
Candida Glabrata: NEGATIVE
Candida Vaginitis: NEGATIVE
Chlamydia: NEGATIVE
Comment: NEGATIVE
Comment: NEGATIVE
Comment: NEGATIVE
Comment: NEGATIVE
Comment: NEGATIVE
Comment: NORMAL
Neisseria Gonorrhea: NEGATIVE
Trichomonas: NEGATIVE

## 2020-04-21 ENCOUNTER — Other Ambulatory Visit: Payer: Self-pay

## 2020-04-21 ENCOUNTER — Ambulatory Visit: Payer: Medicaid Other

## 2020-05-07 ENCOUNTER — Ambulatory Visit (INDEPENDENT_AMBULATORY_CARE_PROVIDER_SITE_OTHER): Payer: Self-pay | Admitting: *Deleted

## 2020-05-07 ENCOUNTER — Other Ambulatory Visit: Payer: Self-pay

## 2020-05-07 ENCOUNTER — Other Ambulatory Visit (HOSPITAL_COMMUNITY)
Admission: RE | Admit: 2020-05-07 | Discharge: 2020-05-07 | Disposition: A | Discharge status: Home / Self Care | Payer: Medicaid Other | Source: Ambulatory Visit | Attending: Obstetrics | Admitting: Obstetrics

## 2020-05-07 DIAGNOSIS — N898 Other specified noninflammatory disorders of vagina: Secondary | ICD-10-CM | POA: Insufficient documentation

## 2020-05-07 NOTE — Progress Notes (Signed)
I have reviewed this chart and agree with the RN/CMA assessment and management.    K. Meryl Estiben Mizuno, MD, FACOG [facility]  

## 2020-05-07 NOTE — Progress Notes (Signed)
SUBJECTIVE:  25 y.o. female complains of vaginal discharge for 1 week(s). Denies abnormal vaginal bleeding or significant pelvic pain or fever. No UTI symptoms. Denies history of known exposure to STD.  No LMP recorded.  OBJECTIVE:  She appears well, afebrile. Urine dipstick: not done.  ASSESSMENT:  Vaginal Discharge     PLAN:  Pt request full swab- GC, chlamydia, trichomonas, BVAG, CVAG probe sent to lab.  Treatment: To be determined once lab results are received ROV prn if symptoms persist or worsen.

## 2020-05-08 ENCOUNTER — Other Ambulatory Visit: Payer: Self-pay | Admitting: Obstetrics and Gynecology

## 2020-05-08 LAB — CERVICOVAGINAL ANCILLARY ONLY
Bacterial Vaginitis (gardnerella): POSITIVE — AB
Candida Glabrata: NEGATIVE
Candida Vaginitis: NEGATIVE
Chlamydia: NEGATIVE
Comment: NEGATIVE
Comment: NEGATIVE
Comment: NEGATIVE
Comment: NEGATIVE
Comment: NEGATIVE
Comment: NORMAL
Neisseria Gonorrhea: NEGATIVE
Trichomonas: NEGATIVE

## 2020-05-08 MED ORDER — METRONIDAZOLE 500 MG PO TABS: 500.0000 mg | ORAL_TABLET | Freq: Two times a day (BID) | ORAL | 0 refills | Status: DC

## 2020-05-26 ENCOUNTER — Other Ambulatory Visit: Payer: Self-pay | Admitting: *Deleted

## 2020-05-26 MED ORDER — METRONIDAZOLE 500 MG PO TABS: 500.0000 mg | ORAL_TABLET | Freq: Two times a day (BID) | ORAL | 0 refills | Status: DC

## 2020-05-26 NOTE — Progress Notes (Signed)
See pt email encounter

## 2020-06-23 ENCOUNTER — Ambulatory Visit: Payer: Medicaid Other

## 2020-06-26 ENCOUNTER — Ambulatory Visit: Payer: Medicaid Other

## 2020-06-26 ENCOUNTER — Other Ambulatory Visit: Payer: Self-pay

## 2020-06-26 ENCOUNTER — Other Ambulatory Visit (HOSPITAL_COMMUNITY)
Admission: RE | Admit: 2020-06-26 | Discharge: 2020-06-26 | Disposition: A | Discharge status: Home / Self Care | Payer: Medicaid Other | Source: Ambulatory Visit | Attending: Obstetrics | Admitting: Obstetrics

## 2020-06-26 DIAGNOSIS — N898 Other specified noninflammatory disorders of vagina: Secondary | ICD-10-CM

## 2020-06-26 NOTE — Progress Notes (Signed)
Pt is in the office for self swab Pt reports vaginal discharge and odor. 

## 2020-06-30 ENCOUNTER — Other Ambulatory Visit: Payer: Self-pay | Admitting: Obstetrics and Gynecology

## 2020-06-30 LAB — CERVICOVAGINAL ANCILLARY ONLY
Bacterial Vaginitis (gardnerella): POSITIVE — AB
Candida Glabrata: NEGATIVE
Candida Vaginitis: POSITIVE — AB
Chlamydia: NEGATIVE
Comment: NEGATIVE
Comment: NEGATIVE
Comment: NEGATIVE
Comment: NEGATIVE
Comment: NEGATIVE
Comment: NORMAL
Neisseria Gonorrhea: NEGATIVE
Trichomonas: NEGATIVE

## 2020-06-30 MED ORDER — METRONIDAZOLE 500 MG PO TABS: 500.0000 mg | ORAL_TABLET | Freq: Two times a day (BID) | ORAL | 0 refills | Status: DC

## 2020-06-30 MED ORDER — FLUCONAZOLE 150 MG PO TABS: 150.0000 mg | ORAL_TABLET | Freq: Once | ORAL | 0 refills | Status: DC

## 2020-06-30 NOTE — Progress Notes (Signed)
Patient was assessed and managed by nursing staff during this encounter. I have reviewed the chart and agree with the documentation and plan. I have also made any necessary editorial changes.  Catalina Antigua, MD 06/30/2020 8:50 AM

## 2020-07-08 ENCOUNTER — Other Ambulatory Visit: Payer: Self-pay

## 2020-07-08 DIAGNOSIS — N898 Other specified noninflammatory disorders of vagina: Secondary | ICD-10-CM

## 2020-07-08 MED ORDER — FLUCONAZOLE 150 MG PO TABS: 150.0000 mg | ORAL_TABLET | Freq: Once | ORAL | 0 refills | Status: DC

## 2020-07-08 NOTE — Progress Notes (Signed)
Pt requested Rx be resent for yeast pt recently  +for yeast and BV  rx sent. Pt made aware through Mychart.

## 2020-07-21 ENCOUNTER — Ambulatory Visit: Payer: Medicaid Other

## 2020-08-12 ENCOUNTER — Ambulatory Visit: Payer: Medicaid Other | Admitting: Obstetrics

## 2020-08-13 ENCOUNTER — Encounter: Payer: Self-pay | Admitting: Obstetrics

## 2020-08-13 ENCOUNTER — Other Ambulatory Visit: Payer: Self-pay

## 2020-08-13 ENCOUNTER — Other Ambulatory Visit (HOSPITAL_COMMUNITY)
Admission: RE | Admit: 2020-08-13 | Discharge: 2020-08-13 | Disposition: A | Discharge status: Home / Self Care | Payer: Medicaid Other | Source: Ambulatory Visit | Attending: Obstetrics | Admitting: Obstetrics

## 2020-08-13 ENCOUNTER — Ambulatory Visit (INDEPENDENT_AMBULATORY_CARE_PROVIDER_SITE_OTHER): Payer: Self-pay | Admitting: Obstetrics

## 2020-08-13 VITALS — BP 113/80 | HR 84 | Ht 65.0 in | Wt 229.0 lb

## 2020-08-13 DIAGNOSIS — B3731 Acute candidiasis of vulva and vagina: Secondary | ICD-10-CM

## 2020-08-13 DIAGNOSIS — B369 Superficial mycosis, unspecified: Secondary | ICD-10-CM

## 2020-08-13 DIAGNOSIS — N898 Other specified noninflammatory disorders of vagina: Secondary | ICD-10-CM | POA: Insufficient documentation

## 2020-08-13 DIAGNOSIS — B373 Candidiasis of vulva and vagina: Secondary | ICD-10-CM

## 2020-08-13 MED ORDER — CLOTRIMAZOLE 1 % EX CREA: 1.0000 "application " | TOPICAL_CREAM | Freq: Two times a day (BID) | CUTANEOUS | 1 refills | Status: DC

## 2020-08-13 MED ORDER — FLUCONAZOLE 200 MG PO TABS: 200.0000 mg | ORAL_TABLET | ORAL | 2 refills | Status: DC

## 2020-08-13 NOTE — Progress Notes (Signed)
GYN presents for STD Screening, c/o discharge, itching.  Denies odor  Last PAP 10/25/2019

## 2020-08-13 NOTE — Progress Notes (Signed)
Patient ID: Nicole Woodard, female   DOB: 01-23-1996, 25 y.o.   MRN: [id number]    HPI Nicole Woodard is a 25 y.o. female.  Complains of vaginal discharge and itching on the outside. HPI  Past Medical History:  Diagnosis Date  . Medical history non-contributory     Past Surgical History:  Procedure Laterality Date  . NO PAST SURGERIES      Family History  Problem Relation Age of Onset  . Hypertension Other   . Cancer Other     Social History Social History   Tobacco Use  . Smoking status: Never Smoker  . Smokeless tobacco: Never Used  Vaping Use  . Vaping Use: Never used  Substance Use Topics  . Alcohol use: No    Comment: occ  . Drug use: No    No Known Allergies  Current Outpatient Medications  Medication Sig Dispense Refill  . clotrimazole (LOTRIMIN) 1 % cream Apply 1 application topically 2 (two) times daily. 60 g 1  . JUNEL FE 24 1-20 MG-MCG(24) tablet TAKE 1 TABLET BY MOUTH DAILY 28 tablet 11  . metroNIDAZOLE (FLAGYL) 500 MG tablet Take 1 tablet (500 mg total) by mouth 2 (two) times daily. (Patient not taking: Reported on 08/13/2020) 14 tablet 0  . fluconazole (DIFLUCAN) 200 MG tablet Take 1 tablet (200 mg total) by mouth every 3 (three) days. 3 tablet 2  . metroNIDAZOLE (FLAGYL) 500 MG tablet Take 1 tablet (500 mg total) by mouth 2 (two) times daily. (Patient not taking: No sig reported) 14 tablet 0  . Prenat w/o A-FeCb-FeGl-DSS-FA (CITRANATAL RX) 27-1 MG TABS Take 1 tablet by mouth daily before breakfast. (Patient not taking: No sig reported) 30 tablet 11   Current Facility-Administered Medications  Medication Dose Route Frequency Provider Last Rate Last Admin  . cefTRIAXone (ROCEPHIN) injection 250 mg  250 mg Intramuscular Once Brock Bad, MD        Review of Systems Review of Systems Constitutional: negative for fatigue and weight loss Respiratory: negative for cough and wheezing Cardiovascular: negative for chest pain, fatigue and  palpitations Gastrointestinal: negative for abdominal pain and change in bowel habits Genitourinary: positive for vaginal itching and discharge Integument/breast: negative for nipple discharge Musculoskeletal:negative for myalgias Neurological: negative for gait problems and tremors Behavioral/Psych: negative for abusive relationship, depression Endocrine: negative for temperature intolerance      Blood pressure 113/80, pulse 84, height 5\' 5"  (1.651 m), weight 229 lb (103.9 kg), last menstrual period 08/11/2020.  Physical Exam Physical Exam General:   alert and no distress  Skin:   no rash or abnormalities  Lungs:   clear to auscultation bilaterally  Heart:   regular rate and rhythm, S1, S2 normal, no murmur, click, rub or gallop  Breasts:   normal without suspicious masses, skin or nipple changes or axillary nodes  Abdomen:  normal findings: no organomegaly, soft, non-tender and no hernia  Pelvis:  External genitalia: fungal rash of vulva and inguinal areas Urinary system: urethral meatus normal and bladder without fullness, nontender Vaginal: normal without tenderness, induration or masses Cervix: normal appearance Adnexa: normal bimanual exam Uterus: anteverted and non-tender, normal size    I have spent a total of 20 minutes of face-to-face time, excluding clinical staff time, reviewing notes and preparing to see patient, ordering tests and/or medications, and counseling the patient.  Data Reviewed Wet Prep and Cultures  Assessment  1. Vaginal discharge Rx: - Cervicovaginal ancillary only( [place])  2. Candida vaginitis  Rx: - fluconazole (DIFLUCAN) 200 MG tablet; Take 1 tablet (200 mg total) by mouth every 3 (three) days.  Dispense: 3 tablet; Refill: 2  3. Superficial fungus infection of skin Rx: - clotrimazole (LOTRIMIN) 1 % cream; Apply 1 application topically 2 (two) times daily.  Dispense: 60 g; Refill: 1  Plan  Follow up prn    Meds ordered this encounter   Medications  . clotrimazole (LOTRIMIN) 1 % cream    Sig: Apply 1 application topically 2 (two) times daily.    Dispense:  60 g    Refill:  1  . fluconazole (DIFLUCAN) 200 MG tablet    Sig: Take 1 tablet (200 mg total) by mouth every 3 (three) days.    Dispense:  3 tablet    Refill:  2      Brock Bad, MD 08/13/2020 3:31 PM

## 2020-08-14 LAB — CERVICOVAGINAL ANCILLARY ONLY
Bacterial Vaginitis (gardnerella): POSITIVE — AB
Candida Glabrata: POSITIVE — AB
Candida Vaginitis: NEGATIVE
Chlamydia: NEGATIVE
Comment: NEGATIVE
Comment: NEGATIVE
Comment: NEGATIVE
Comment: NEGATIVE
Comment: NEGATIVE
Comment: NORMAL
Neisseria Gonorrhea: NEGATIVE
Trichomonas: NEGATIVE

## 2020-08-16 ENCOUNTER — Other Ambulatory Visit: Payer: Self-pay | Admitting: Obstetrics

## 2020-08-16 DIAGNOSIS — B9689 Other specified bacterial agents as the cause of diseases classified elsewhere: Secondary | ICD-10-CM

## 2020-08-16 MED ORDER — METRONIDAZOLE 500 MG PO TABS: 500.0000 mg | ORAL_TABLET | Freq: Two times a day (BID) | ORAL | 0 refills | Status: DC

## 2020-09-25 ENCOUNTER — Encounter: Payer: Self-pay | Admitting: Obstetrics

## 2020-09-25 ENCOUNTER — Other Ambulatory Visit: Payer: Self-pay

## 2020-09-25 ENCOUNTER — Other Ambulatory Visit (HOSPITAL_COMMUNITY)
Admission: RE | Admit: 2020-09-25 | Discharge: 2020-09-25 | Disposition: A | Discharge status: Home / Self Care | Payer: Medicaid Other | Source: Ambulatory Visit | Attending: Obstetrics | Admitting: Obstetrics

## 2020-09-25 ENCOUNTER — Ambulatory Visit (INDEPENDENT_AMBULATORY_CARE_PROVIDER_SITE_OTHER): Payer: Medicaid Other | Admitting: Obstetrics

## 2020-09-25 VITALS — BP 120/79 | HR 80 | Wt 233.0 lb

## 2020-09-25 DIAGNOSIS — B379 Candidiasis, unspecified: Secondary | ICD-10-CM

## 2020-09-25 DIAGNOSIS — N760 Acute vaginitis: Secondary | ICD-10-CM

## 2020-09-25 DIAGNOSIS — N898 Other specified noninflammatory disorders of vagina: Secondary | ICD-10-CM

## 2020-09-25 DIAGNOSIS — Z3041 Encounter for surveillance of contraceptive pills: Secondary | ICD-10-CM

## 2020-09-25 DIAGNOSIS — Z113 Encounter for screening for infections with a predominantly sexual mode of transmission: Secondary | ICD-10-CM

## 2020-09-25 DIAGNOSIS — B9689 Other specified bacterial agents as the cause of diseases classified elsewhere: Secondary | ICD-10-CM

## 2020-09-25 MED ORDER — METRONIDAZOLE 500 MG PO TABS: 500.0000 mg | ORAL_TABLET | Freq: Two times a day (BID) | ORAL | 2 refills | Status: DC

## 2020-09-25 MED ORDER — FLUCONAZOLE 150 MG PO TABS: 150.0000 mg | ORAL_TABLET | Freq: Once | ORAL | 2 refills | Status: DC

## 2020-09-25 NOTE — Progress Notes (Signed)
Pt c/o vaginal irritation and requests all STD testing.  Normal pap smear 10/25/2019

## 2020-09-25 NOTE — Progress Notes (Signed)
Patient ID: Nicole Woodard, female   DOB: [birth year], 25 y.o.   MRN: [id number]  No chief complaint on file.   HPI Nicole Woodard is a 25 y.o. female.  Complains of vaginal discharge with irritation. HPI  Past Medical History:  Diagnosis Date  . Medical history non-contributory     Past Surgical History:  Procedure Laterality Date  . NO PAST SURGERIES      Family History  Problem Relation Age of Onset  . Hypertension Other   . Cancer Other     Social History Social History   Tobacco Use  . Smoking status: Never Smoker  . Smokeless tobacco: Never Used  Vaping Use  . Vaping Use: Never used  Substance Use Topics  . Alcohol use: No    Comment: occ  . Drug use: No    No Known Allergies  Current Outpatient Medications  Medication Sig Dispense Refill  . fluconazole (DIFLUCAN) 150 MG tablet Take 1 tablet (150 mg total) by mouth once for 1 dose. 1 tablet 2  . JUNEL FE 24 1-20 MG-MCG(24) tablet TAKE 1 TABLET BY MOUTH DAILY 28 tablet 11  . metroNIDAZOLE (FLAGYL) 500 MG tablet Take 1 tablet (500 mg total) by mouth 2 (two) times daily. 14 tablet 2  . clotrimazole (LOTRIMIN) 1 % cream Apply 1 application topically 2 (two) times daily. (Patient not taking: Reported on 09/25/2020) 60 g 1  . Prenat w/o A-FeCb-FeGl-DSS-FA (CITRANATAL RX) 27-1 MG TABS Take 1 tablet by mouth daily before breakfast. (Patient not taking: No sig reported) 30 tablet 11   No current facility-administered medications for this visit.    Review of Systems Review of Systems Constitutional: negative for fatigue and weight loss Respiratory: negative for cough and wheezing Cardiovascular: negative for chest pain, fatigue and palpitations Gastrointestinal: negative for abdominal pain and change in bowel habits Genitourinary:negative Integument/breast: negative for nipple discharge Musculoskeletal:negative for myalgias Neurological: negative for gait problems and tremors Behavioral/Psych: negative for  abusive relationship, depression Endocrine: negative for temperature intolerance      Blood pressure 120/79, pulse 80, weight 233 lb (105.7 kg), last menstrual period 09/04/2020.  Physical Exam Physical Exam General:   alert and no distress  Skin:   no rash or abnormalities  Lungs:   clear to auscultation bilaterally  Heart:   regular rate and rhythm, S1, S2 normal, no murmur, click, rub or gallop  Breasts:   not examined  Abdomen:  normal findings: no organomegaly, soft, non-tender and no hernia  Pelvis:  External genitalia: normal general appearance Urinary system: urethral meatus normal and bladder without fullness, nontender Vaginal: normal without tenderness, induration or masses Cervix: normal appearance Adnexa: normal bimanual exam Uterus: anteverted and non-tender, normal size    I have spent a total of 15 minutes of face-to-face and  time, excluding clinical staff time, reviewing notes and preparing to see patient, ordering tests and/or medications, and counseling the patient.  Data Reviewed Wet Prep  Assessment     1. Vaginal discharge Rx: - Cervicovaginal ancillary only  2. Screen for STD (sexually transmitted disease) Rx: - Hepatitis B surface antigen - Hepatitis C antibody - HIV Antibody (routine testing w rflx) - RPR  3. BV (bacterial vaginosis) Rx: - metroNIDAZOLE (FLAGYL) 500 MG tablet; Take 1 tablet (500 mg total) by mouth 2 (two) times daily.  Dispense: 14 tablet; Refill: 2  4. Candida infection Rx: - fluconazole (DIFLUCAN) 150 MG tablet; Take 1 tablet (150 mg total) by mouth once for 1  dose.  Dispense: 1 tablet; Refill: 2    Plan   Follow up prn  Orders Placed This Encounter  Procedures  . Hepatitis B surface antigen  . Hepatitis C antibody  . HIV Antibody (routine testing w rflx)  . RPR   Meds ordered this encounter  Medications  . metroNIDAZOLE (FLAGYL) 500 MG tablet    Sig: Take 1 tablet (500 mg total) by mouth 2 (two) times daily.     Dispense:  14 tablet    Refill:  2  . fluconazole (DIFLUCAN) 150 MG tablet    Sig: Take 1 tablet (150 mg total) by mouth once for 1 dose.    Dispense:  1 tablet    Refill:  2     Brock Bad, MD 09/25/2020 5:03 PM

## 2020-09-26 LAB — HEPATITIS B SURFACE ANTIGEN: Hepatitis B Surface Ag: NEGATIVE

## 2020-09-26 LAB — RPR: RPR Ser Ql: NONREACTIVE

## 2020-09-26 LAB — HIV ANTIBODY (ROUTINE TESTING W REFLEX): HIV Screen 4th Generation wRfx: NONREACTIVE

## 2020-09-26 LAB — HEPATITIS C ANTIBODY: Hep C Virus Ab: 8.2 s/co ratio — ABNORMAL HIGH (ref 0.0–0.9)

## 2020-09-30 LAB — CERVICOVAGINAL ANCILLARY ONLY
Bacterial Vaginitis (gardnerella): POSITIVE — AB
Candida Glabrata: NEGATIVE
Candida Vaginitis: NEGATIVE
Chlamydia: NEGATIVE
Comment: NEGATIVE
Comment: NEGATIVE
Comment: NEGATIVE
Comment: NEGATIVE
Comment: NEGATIVE
Comment: NORMAL
Neisseria Gonorrhea: NEGATIVE
Trichomonas: NEGATIVE

## 2020-10-02 ENCOUNTER — Other Ambulatory Visit: Payer: Self-pay | Admitting: Obstetrics

## 2020-10-03 ENCOUNTER — Other Ambulatory Visit: Payer: Self-pay | Admitting: *Deleted

## 2020-10-03 DIAGNOSIS — B1920 Unspecified viral hepatitis C without hepatic coma: Secondary | ICD-10-CM

## 2020-10-03 NOTE — Progress Notes (Signed)
Referral to ID entered for +Hep C

## 2020-10-27 ENCOUNTER — Other Ambulatory Visit: Payer: Self-pay

## 2020-10-27 MED ORDER — FLUCONAZOLE 150 MG PO TABS: 150.0000 mg | ORAL_TABLET | Freq: Once | ORAL | 0 refills | Status: DC

## 2020-10-27 MED ORDER — METRONIDAZOLE 500 MG PO TABS: 500.0000 mg | ORAL_TABLET | Freq: Two times a day (BID) | ORAL | 0 refills | Status: DC

## 2020-10-27 NOTE — Telephone Encounter (Signed)
Patient would like a refill on flagyl and diflucion.

## 2020-11-12 ENCOUNTER — Other Ambulatory Visit: Payer: Self-pay

## 2020-11-13 ENCOUNTER — Other Ambulatory Visit: Payer: Self-pay

## 2020-11-14 MED ORDER — METRONIDAZOLE 500 MG PO TABS: 500.0000 mg | ORAL_TABLET | Freq: Two times a day (BID) | ORAL | 0 refills | Status: DC

## 2020-11-17 ENCOUNTER — Ambulatory Visit (INDEPENDENT_AMBULATORY_CARE_PROVIDER_SITE_OTHER): Payer: Self-pay

## 2020-11-17 ENCOUNTER — Other Ambulatory Visit: Payer: Self-pay

## 2020-11-17 ENCOUNTER — Other Ambulatory Visit (HOSPITAL_COMMUNITY)
Admission: RE | Admit: 2020-11-17 | Discharge: 2020-11-17 | Disposition: A | Discharge status: Home / Self Care | Payer: Medicaid Other | Source: Ambulatory Visit | Attending: Obstetrics and Gynecology | Admitting: Obstetrics and Gynecology

## 2020-11-17 VITALS — BP 103/70 | HR 106 | Wt 228.0 lb

## 2020-11-17 DIAGNOSIS — N898 Other specified noninflammatory disorders of vagina: Secondary | ICD-10-CM

## 2020-11-17 DIAGNOSIS — Z113 Encounter for screening for infections with a predominantly sexual mode of transmission: Secondary | ICD-10-CM | POA: Diagnosis not present

## 2020-11-17 NOTE — Progress Notes (Addendum)
SUBJECTIVE:  25 y.o. female complains of white and malodorous vaginal discharge, itching for 7 day(s). Denies abnormal vaginal bleeding or significant pelvic pain or fever. No UTI symptoms. Denies history of known exposure to STD.  No LMP recorded.  OBJECTIVE:  She appears well, afebrile. Urine dipstick: not done.  ASSESSMENT:  Vaginal Discharge  Vaginal Odor Vaginal Itching   PLAN:  GC, chlamydia, trichomonas,  probe sent to lab. Treatment: To be determined once lab results are received ROV prn if symptoms persist or worsen.

## 2020-11-18 LAB — CERVICOVAGINAL ANCILLARY ONLY
Chlamydia: NEGATIVE
Comment: NEGATIVE
Comment: NEGATIVE
Comment: NORMAL
Neisseria Gonorrhea: NEGATIVE
Trichomonas: NEGATIVE

## 2020-11-18 NOTE — Progress Notes (Signed)
Patient was assessed and managed by nursing staff during this encounter. I have reviewed the chart and agree with the documentation and plan. I have also made any necessary editorial changes.  Gwendolyne Welford, MD 11/18/2020 9:51 AM   

## 2020-11-19 ENCOUNTER — Other Ambulatory Visit: Payer: Self-pay

## 2020-11-19 DIAGNOSIS — B379 Candidiasis, unspecified: Secondary | ICD-10-CM

## 2020-11-19 MED ORDER — FLUCONAZOLE 150 MG PO TABS: 150.0000 mg | ORAL_TABLET | Freq: Once | ORAL | 0 refills | Status: DC

## 2020-12-04 ENCOUNTER — Ambulatory Visit: Payer: Medicaid Other | Admitting: Obstetrics

## 2020-12-09 ENCOUNTER — Ambulatory Visit: Payer: Medicaid Other | Admitting: Obstetrics

## 2021-01-22 ENCOUNTER — Ambulatory Visit: Payer: Medicaid Other

## 2021-01-27 ENCOUNTER — Other Ambulatory Visit (HOSPITAL_COMMUNITY)
Admission: RE | Admit: 2021-01-27 | Discharge: 2021-01-27 | Disposition: A | Discharge status: Home / Self Care | Payer: Medicaid Other | Source: Ambulatory Visit | Attending: Obstetrics and Gynecology | Admitting: Obstetrics and Gynecology

## 2021-01-27 ENCOUNTER — Ambulatory Visit (INDEPENDENT_AMBULATORY_CARE_PROVIDER_SITE_OTHER): Payer: Medicaid Other

## 2021-01-27 ENCOUNTER — Other Ambulatory Visit: Payer: Self-pay

## 2021-01-27 DIAGNOSIS — Z113 Encounter for screening for infections with a predominantly sexual mode of transmission: Secondary | ICD-10-CM | POA: Insufficient documentation

## 2021-01-27 NOTE — Progress Notes (Signed)
Patient was assessed and managed by nursing staff during this encounter. I have reviewed the chart and agree with the documentation and plan. I have also made any necessary editorial changes.  Warden Fillers, MD 01/27/2021 11:19 AM

## 2021-01-27 NOTE — Progress Notes (Signed)
Patient presents for STD testing. Patient states that she would like to be checked for STDs, yeast, and BV. Patient made aware that her insurance does not cover trich, yeast, or bv. She states that she still wants them don and that she will get a bill.  She denies having any vaginal discharge, odor, or irritation. Self swab completed.

## 2021-01-28 LAB — CERVICOVAGINAL ANCILLARY ONLY
Bacterial Vaginitis (gardnerella): POSITIVE — AB
Candida Glabrata: NEGATIVE
Candida Vaginitis: NEGATIVE
Chlamydia: NEGATIVE
Comment: NEGATIVE
Comment: NEGATIVE
Comment: NEGATIVE
Comment: NEGATIVE
Comment: NEGATIVE
Comment: NORMAL
Neisseria Gonorrhea: NEGATIVE
Trichomonas: POSITIVE — AB

## 2021-01-29 ENCOUNTER — Other Ambulatory Visit: Payer: Self-pay | Admitting: *Deleted

## 2021-01-29 ENCOUNTER — Other Ambulatory Visit: Payer: Self-pay

## 2021-01-29 ENCOUNTER — Encounter: Payer: Self-pay | Admitting: *Deleted

## 2021-01-29 DIAGNOSIS — B9689 Other specified bacterial agents as the cause of diseases classified elsewhere: Secondary | ICD-10-CM

## 2021-01-29 DIAGNOSIS — A599 Trichomoniasis, unspecified: Secondary | ICD-10-CM

## 2021-01-29 MED ORDER — METRONIDAZOLE 500 MG PO TABS: 500.0000 mg | ORAL_TABLET | Freq: Two times a day (BID) | ORAL | 0 refills | Status: DC

## 2021-02-09 ENCOUNTER — Other Ambulatory Visit: Payer: Self-pay | Admitting: Obstetrics

## 2021-02-09 DIAGNOSIS — Z3041 Encounter for surveillance of contraceptive pills: Secondary | ICD-10-CM

## 2021-02-19 ENCOUNTER — Other Ambulatory Visit: Payer: Self-pay

## 2021-02-19 ENCOUNTER — Ambulatory Visit: Payer: Medicaid Other | Admitting: Obstetrics

## 2021-02-19 ENCOUNTER — Encounter: Payer: Self-pay | Admitting: Obstetrics

## 2021-02-19 ENCOUNTER — Other Ambulatory Visit (HOSPITAL_COMMUNITY)
Admission: RE | Admit: 2021-02-19 | Discharge: 2021-02-19 | Disposition: A | Discharge status: Home / Self Care | Payer: Medicaid Other | Source: Ambulatory Visit | Attending: Obstetrics | Admitting: Obstetrics

## 2021-02-19 VITALS — BP 120/82 | HR 96 | Ht 65.0 in | Wt 231.0 lb

## 2021-02-19 DIAGNOSIS — N760 Acute vaginitis: Secondary | ICD-10-CM

## 2021-02-19 DIAGNOSIS — B9689 Other specified bacterial agents as the cause of diseases classified elsewhere: Secondary | ICD-10-CM

## 2021-02-19 DIAGNOSIS — Z01419 Encounter for gynecological examination (general) (routine) without abnormal findings: Secondary | ICD-10-CM

## 2021-02-19 DIAGNOSIS — E669 Obesity, unspecified: Secondary | ICD-10-CM

## 2021-02-19 DIAGNOSIS — N898 Other specified noninflammatory disorders of vagina: Secondary | ICD-10-CM | POA: Diagnosis present

## 2021-02-19 DIAGNOSIS — Z3041 Encounter for surveillance of contraceptive pills: Secondary | ICD-10-CM | POA: Diagnosis not present

## 2021-02-19 MED ORDER — AUROVELA 24 FE 1-20 MG-MCG(24) PO TABS: 1.0000 | ORAL_TABLET | Freq: Every day | ORAL | 11 refills | Status: DC

## 2021-02-19 MED ORDER — NUVESSA 1.3 % VA GEL: 1.0000 | Freq: Once | VAGINAL | 2 refills | Status: AC

## 2021-02-19 NOTE — Progress Notes (Signed)
Pt needs annual and pap smear. Pt c/o recurrent BV infections.  She is experiencing malodorous vaginal discharge. Normal pap smear on 10/25/2019

## 2021-02-19 NOTE — Progress Notes (Signed)
Subjective:        Nicole Woodard is a 25 y.o. female here for a routine exam.  Current complaints: Malodorous vaginal discharge.    Personal health questionnaire:  Is patient [name], have a family history of breast and/or ovarian cancer: no Is there a family history of uterine cancer diagnosed at age < 74, gastrointestinal cancer, urinary tract cancer, family member who is a Personnel officer syndrome-associated carrier: no Is the patient overweight and hypertensive, family history of diabetes, personal history of gestational diabetes, preeclampsia or PCOS: no Is patient over 31, have PCOS,  family history of premature CHD under age 50, diabetes, smoke, have hypertension or peripheral artery disease:  no At any time, has a partner hit, kicked or otherwise hurt or frightened you?: no Over the past 2 weeks, have you felt down, depressed or hopeless?: no Over the past 2 weeks, have you felt little interest or pleasure in doing things?:no   Gynecologic History Patient's last menstrual period was 02/09/2021. Contraception: OCP (estrogen/progesterone) Last Pap: 6-24-2-21. Results were: normal Last mammogram: n/a. Results were: n/a  Obstetric History OB History  Gravida Para Term Preterm AB Living  1 1 1     1   SAB IAB Ectopic Multiple Live Births        0 1    # Outcome Date GA Lbr Len/2nd Weight Sex Delivery Anes PTL Lv  1 Term 03/10/15 [redacted]w[redacted]d 12:38 / 00:43 7 lb 7.1 oz (3.375 kg) M Vag-Spont EPI  LIV    Past Medical History:  Diagnosis Date   Medical history non-contributory     Past Surgical History:  Procedure Laterality Date   NO PAST SURGERIES       Current Outpatient Medications:    AUROVELA 24 FE 1-20 MG-MCG(24) tablet, TAKE 1 TABLET BY MOUTH DAILY, Disp: 28 tablet, Rfl: 11   metroNIDAZOLE (FLAGYL) 500 MG tablet, Take 1 tablet (500 mg total) by mouth 2 (two) times daily. (Patient not taking: Reported on 02/19/2021), Disp: 14 tablet, Rfl: 0   clotrimazole (LOTRIMIN)  1 % cream, Apply 1 application topically 2 (two) times daily. (Patient not taking: No sig reported), Disp: 60 g, Rfl: 1   fluconazole (DIFLUCAN) 150 MG tablet, Take 1 tablet (150 mg total) by mouth once for 1 dose., Disp: 1 tablet, Rfl: 0   metroNIDAZOLE (FLAGYL) 500 MG tablet, Take 1 tablet (500 mg total) by mouth 2 (two) times daily. (Patient not taking: Reported on 02/19/2021), Disp: 14 tablet, Rfl: 2   metroNIDAZOLE (FLAGYL) 500 MG tablet, Take 1 tablet (500 mg total) by mouth 2 (two) times daily. (Patient not taking: Reported on 02/19/2021), Disp: 14 tablet, Rfl: 0   Prenat w/o A-FeCb-FeGl-DSS-FA (CITRANATAL RX) 27-1 MG TABS, Take 1 tablet by mouth daily before breakfast. (Patient not taking: No sig reported), Disp: 30 tablet, Rfl: 11 No Known Allergies  Social History   Tobacco Use   Smoking status: Never   Smokeless tobacco: Never  Substance Use Topics   Alcohol use: No    Comment: occ    Family History  Problem Relation Age of Onset   Hypertension Other    Cancer Other       Review of Systems  Constitutional: negative for fatigue and weight loss Respiratory: negative for cough and wheezing Cardiovascular: negative for chest pain, fatigue and palpitations Gastrointestinal: negative for abdominal pain and change in bowel habits Musculoskeletal:negative for myalgias Neurological: negative for gait problems and tremors Behavioral/Psych: negative for abusive relationship, depression Endocrine:  negative for temperature intolerance    Genitourinary: positive for malodorous vaginal discharge.  negative for abnormal menstrual periods, genital lesions, hot flashes, sexual problems Integument/breast: negative for breast lump, breast tenderness, nipple discharge and skin lesion(s)    Objective:       BP 120/82   Pulse 96   Ht 5\' 5"  (1.651 m)   Wt 231 lb (104.8 kg)   LMP 02/09/2021   BMI 38.44 kg/m  General:   Alert and no distress  Skin:   no rash or abnormalities  Lungs:    clear to auscultation bilaterally  Heart:   regular rate and rhythm, S1, S2 normal, no murmur, click, rub or gallop  Breasts:   normal without suspicious masses, skin or nipple changes or axillary nodes  Abdomen:  normal findings: no organomegaly, soft, non-tender and no hernia  Pelvis:  External genitalia: normal general appearance Urinary system: urethral meatus normal and bladder without fullness, nontender Vaginal: normal without tenderness, induration or masses Cervix: normal appearance Adnexa: normal bimanual exam Uterus: anteverted and non-tender, normal size   Lab Review Urine pregnancy test Labs reviewed yes Radiologic studies reviewed no  I have spent a total of 20 minutes of face-to-face time, excluding clinical staff time, reviewing notes and preparing to see patient, ordering tests and/or medications, and counseling the patient.   Assessment:    1. Encounter for routine gynecological examination with Papanicolaou smear of cervix Rx: - Cytology - PAP( [place])  2. Vaginal discharge Rx: - Cervicovaginal ancillary only( [place])  3. BV (bacterial vaginosis) Rx: - metroNIDAZOLE (NUVESSA) 1.3 % GEL; Place 1 Applicatorful vaginally once for 1 dose.  Dispense: 5 g; Refill: 2  4. Encounter for surveillance of contraceptive pills Rx: - Norethindrone Acetate-Ethinyl Estrad-FE (AUROVELA 24 FE) 1-20 MG-MCG(24) tablet; Take 1 tablet by mouth daily.  Dispense: 28 tablet; Refill: 11  5. Obesity (BMI 30-39.9) - weight reduction with the aid of dietary  changes, exercise and behavioral modification recommended   Plan:    Education reviewed: calcium supplements, depression evaluation, low fat, low cholesterol diet, safe sex/STD prevention, self breast exams, and weight bearing exercise. Contraception: OCP (estrogen/progesterone). Follow up in: 1 year.    04/11/2021, MD 02/19/2021 10:16 AM

## 2021-02-25 LAB — CYTOLOGY - PAP

## 2021-02-26 ENCOUNTER — Other Ambulatory Visit: Payer: Self-pay

## 2021-02-26 DIAGNOSIS — B9689 Other specified bacterial agents as the cause of diseases classified elsewhere: Secondary | ICD-10-CM

## 2021-02-26 MED ORDER — METRONIDAZOLE 500 MG PO TABS
500.0000 mg | ORAL_TABLET | Freq: Two times a day (BID) | ORAL | 0 refills | Status: DC
Start: 2021-02-26 — End: 2021-04-03

## 2021-03-02 ENCOUNTER — Telehealth: Payer: Self-pay

## 2021-03-02 NOTE — Telephone Encounter (Signed)
Telephoned patient at mobile number. Mailbox full, unable to leave a message with BCCCP contact information.

## 2021-04-01 ENCOUNTER — Ambulatory Visit (INDEPENDENT_AMBULATORY_CARE_PROVIDER_SITE_OTHER): Payer: Medicaid Other

## 2021-04-01 ENCOUNTER — Other Ambulatory Visit: Payer: Self-pay

## 2021-04-01 ENCOUNTER — Other Ambulatory Visit (HOSPITAL_COMMUNITY)
Admission: RE | Admit: 2021-04-01 | Discharge: 2021-04-01 | Disposition: A | Discharge status: Home / Self Care | Payer: Medicaid Other | Source: Ambulatory Visit | Attending: Obstetrics and Gynecology | Admitting: Obstetrics and Gynecology

## 2021-04-01 VITALS — BP 115/76 | HR 103 | Ht 65.0 in | Wt 231.0 lb

## 2021-04-01 DIAGNOSIS — Z113 Encounter for screening for infections with a predominantly sexual mode of transmission: Secondary | ICD-10-CM

## 2021-04-01 DIAGNOSIS — N898 Other specified noninflammatory disorders of vagina: Secondary | ICD-10-CM | POA: Insufficient documentation

## 2021-04-01 NOTE — Progress Notes (Signed)
SUBJECTIVE:  25 y.o. female complains of white vaginal discharge for 3 day(s). Denies abnormal vaginal bleeding or significant pelvic pain or fever. No UTI symptoms. Denies history of known exposure to STD.  Patient's last menstrual period was 03/25/2021 (approximate).  OBJECTIVE:  She appears well, afebrile. Urine dipstick: not done.  ASSESSMENT:  Vaginal Discharge  Vaginal Itching   PLAN:  GC, chlamydia, trichomonas, BVAG, CVAG probe sent to lab. Treatment: To be determined once lab results are received ROV prn if symptoms persist or worsen.

## 2021-04-02 ENCOUNTER — Encounter: Payer: Self-pay | Admitting: Obstetrics

## 2021-04-02 LAB — CERVICOVAGINAL ANCILLARY ONLY
Bacterial Vaginitis (gardnerella): POSITIVE — AB
Candida Glabrata: NEGATIVE
Candida Vaginitis: NEGATIVE
Chlamydia: NEGATIVE
Comment: NEGATIVE
Comment: NEGATIVE
Comment: NEGATIVE
Comment: NEGATIVE
Comment: NEGATIVE
Comment: NORMAL
Neisseria Gonorrhea: NEGATIVE
Trichomonas: NEGATIVE

## 2021-04-03 ENCOUNTER — Other Ambulatory Visit: Payer: Self-pay

## 2021-04-03 MED ORDER — METRONIDAZOLE 0.75 % VA GEL: 1.0000 | Freq: Every day | VAGINAL | Status: AC

## 2021-04-03 NOTE — Progress Notes (Signed)
Patient prefers gel for BV.

## 2021-04-06 ENCOUNTER — Other Ambulatory Visit: Payer: Self-pay | Admitting: Obstetrics and Gynecology

## 2021-04-06 MED ORDER — METRONIDAZOLE 0.75 % VA GEL: 1.0000 | Freq: Every day | VAGINAL | 0 refills | Status: AC

## 2021-04-08 ENCOUNTER — Encounter: Payer: Medicaid Other | Admitting: Obstetrics

## 2021-04-14 ENCOUNTER — Other Ambulatory Visit: Payer: Self-pay

## 2021-04-14 ENCOUNTER — Ambulatory Visit: Payer: Self-pay | Admitting: *Deleted

## 2021-04-14 VITALS — BP 118/84 | Wt 229.5 lb

## 2021-04-14 DIAGNOSIS — Z1239 Encounter for other screening for malignant neoplasm of breast: Secondary | ICD-10-CM

## 2021-04-14 DIAGNOSIS — R87612 Low grade squamous intraepithelial lesion on cytologic smear of cervix (LGSIL): Secondary | ICD-10-CM

## 2021-04-14 NOTE — Progress Notes (Signed)
Nicole Woodard is a 25 y.o. female who presents to [facility] clinic today with no complaints. Patient referred to [facility] by the Center for [facility] at [facility] due to having an abnormal Pap smear 02/19/2021 that a colposcopy is recommended for follow-up.   Pap Smear: Pap smear not completed today. Last Pap smear was 02/19/2021 at [facility] for [facility] clinic and was abnormal - LSIL . Per patient has no history of an abnormal Pap smear prior to her most recent Pap smear. Last Pap smear result is available in Epic.   Physical exam: Breasts Breasts symmetrical. No skin abnormalities bilateral breasts. No nipple retraction bilateral breasts. No nipple discharge bilateral breasts. No lymphadenopathy. No lumps palpated bilateral breasts. No complaints of pain or tenderness on exam.       Pelvic/Bimanual Pap is not indicated today per BCCCP guidelines.   Smoking History: Patient is a former smoker that quit in 2016.   Patient Navigation: Patient education provided. Access to services provided for patient through BCCCP program.   Breast and Cervical Cancer Risk Assessment: Patient does not have family history of breast cancer, known genetic mutations, or radiation treatment to the chest before age 30. Patient does not have history of cervical dysplasia, immunocompromised, or DES exposure in-utero. Breast cancer risk assessment completed. Breast cancer risk not calculated due to patient is less than 61 years old.  Risk Assessment     Risk Scores       04/14/2021   Last edited by: Meryl Dare, CMA   5-year risk:    Lifetime risk:            A: B[facility]CP exam without pap smear No complaints.  P: Referred patient to the [facility] for [facility] for a colposcopy to follow-up for her abnormal Pap smear. The [facility] for [facility] at [facility] will call patient with appointment.  Priscille Heidelberg,  RN 04/14/2021 1:43 PM

## 2021-04-14 NOTE — Patient Instructions (Signed)
Explained breast self awareness with Nicole Woodard. Patient did not need a Pap smear today due to last Pap smear was 02/19/2021. Explained the colposcopy the recommended follow-up for her abnormal Pap smear. Referred patient to the [facility] for [facility] at [facility] for a colposcopy to follow-up for her abnormal Pap smear. The [facility] for [facility] at [facility] will call patient with appointment. Let patient know a screening mammogram is recommended at age 38 unless clinically indicated prior. [name] verbalized understanding.  [name], [name], RN 1:43 PM

## 2021-05-13 ENCOUNTER — Ambulatory Visit: Payer: Medicaid Other

## 2021-05-26 ENCOUNTER — Encounter: Payer: Self-pay | Admitting: Obstetrics

## 2021-05-26 ENCOUNTER — Other Ambulatory Visit (HOSPITAL_COMMUNITY)
Admission: RE | Admit: 2021-05-26 | Discharge: 2021-05-26 | Disposition: A | Discharge status: Home / Self Care | Payer: Medicaid Other | Source: Ambulatory Visit | Attending: Obstetrics | Admitting: Obstetrics

## 2021-05-26 ENCOUNTER — Ambulatory Visit (INDEPENDENT_AMBULATORY_CARE_PROVIDER_SITE_OTHER): Payer: Self-pay | Admitting: Obstetrics

## 2021-05-26 ENCOUNTER — Other Ambulatory Visit: Payer: Self-pay

## 2021-05-26 VITALS — BP 122/77 | HR 103 | Ht 65.5 in | Wt 229.0 lb

## 2021-05-26 DIAGNOSIS — R87612 Low grade squamous intraepithelial lesion on cytologic smear of cervix (LGSIL): Secondary | ICD-10-CM | POA: Insufficient documentation

## 2021-05-26 DIAGNOSIS — N898 Other specified noninflammatory disorders of vagina: Secondary | ICD-10-CM | POA: Insufficient documentation

## 2021-05-26 NOTE — Progress Notes (Signed)
Colposcopy Procedure Note  Indications: Pap smear 3 months ago showed: low-grade squamous intraepithelial neoplasia (LGSIL - encompassing HPV,mild dysplasia,CIN I). The prior pap showed no abnormalities.  Prior cervical/vaginal disease: normal exam without visible pathology. Prior cervical treatment: no treatment.  Procedure Details  The risks and benefits of the procedure and Written informed consent obtained.  A time-out was performed confirming the patient, procedure and allergy status  Speculum placed in vagina and excellent visualization of cervix achieved, cervix swabbed x 3 with acetic acid solution.  Findings: Cervix: no visible lesions, no mosaicism, no punctation, and no abnormal vasculature; Marland Kitchen   Vaginal inspection: normal without visible lesionsSCJ visualized 360 degrees without lesions, endocervical curettage performed, cervical biopsies taken at 6 and 12 o'clock, specimen labelled and sent to pathology, and hemostasis achieved with silver nitrate. Vulvar colposcopy: vulvar colposcopy not performed.   Physical Exam   Specimens: ECC and Cervical Biopsies  Complications: none.  Plan: Specimens labelled and sent to Pathology. Will base further treatment on Pathology findings. Treatment options discussed with patient. Post biopsy instructions given to patient. Return to discuss Pathology results in 2 weeks.   Brock Bad, MD 05/26/2021 12:27 PM

## 2021-05-26 NOTE — Progress Notes (Signed)
GYN presents for COLPO for LSIL on PAP.  UPT today is  NEGATIVE 

## 2021-05-27 ENCOUNTER — Encounter: Payer: Self-pay | Admitting: Obstetrics

## 2021-05-28 ENCOUNTER — Encounter: Payer: Self-pay | Admitting: Obstetrics

## 2021-05-28 ENCOUNTER — Ambulatory Visit (INDEPENDENT_AMBULATORY_CARE_PROVIDER_SITE_OTHER): Payer: Medicaid Other

## 2021-05-28 ENCOUNTER — Telehealth: Payer: Self-pay | Admitting: *Deleted

## 2021-05-28 ENCOUNTER — Other Ambulatory Visit: Payer: Self-pay

## 2021-05-28 DIAGNOSIS — A549 Gonococcal infection, unspecified: Secondary | ICD-10-CM

## 2021-05-28 LAB — CERVICOVAGINAL ANCILLARY ONLY
Bacterial Vaginitis (gardnerella): POSITIVE — AB
Candida Glabrata: NEGATIVE
Candida Vaginitis: NEGATIVE
Chlamydia: NEGATIVE
Comment: NEGATIVE
Comment: NEGATIVE
Comment: NEGATIVE
Comment: NEGATIVE
Comment: NEGATIVE
Comment: NORMAL
Neisseria Gonorrhea: POSITIVE — AB
Trichomonas: NEGATIVE

## 2021-05-28 LAB — SURGICAL PATHOLOGY

## 2021-05-28 LAB — POCT URINE PREGNANCY: Preg Test, Ur: NEGATIVE

## 2021-05-28 MED ORDER — CEFTRIAXONE SODIUM 500 MG IJ SOLR
500.0000 mg | Freq: Once | INTRAMUSCULAR | Status: AC
  Administered 2021-05-28: 500 mg via INTRAMUSCULAR

## 2021-05-28 NOTE — Telephone Encounter (Signed)
TC from patient regarding positive GC results. Dr. Clearance Coots notified as results not reviewed yet. Orders for Rocephin 500 mg IM received. Patient will come in today.

## 2021-05-28 NOTE — Progress Notes (Signed)
Patient presents for rocephin injection for positive GC. Patient advised to abstain from sexual intercourse for at least a week after she and her partner has completed treatment.  500 mg ceftriaxone reconstituted with 1 ml 1% lidocaine given in LUOQ. Patient tolerated well. Patient advised to schedule appt for TOC in 4-6 weeks.

## 2021-06-02 ENCOUNTER — Other Ambulatory Visit: Payer: Self-pay | Admitting: Obstetrics

## 2021-06-02 DIAGNOSIS — A549 Gonococcal infection, unspecified: Secondary | ICD-10-CM

## 2021-06-02 DIAGNOSIS — N760 Acute vaginitis: Secondary | ICD-10-CM

## 2021-06-02 DIAGNOSIS — B9689 Other specified bacterial agents as the cause of diseases classified elsewhere: Secondary | ICD-10-CM

## 2021-06-02 MED ORDER — CEFTRIAXONE SODIUM 500 MG IJ SOLR
500.0000 mg | Freq: Once | INTRAMUSCULAR | Status: AC
  Administered 2022-02-11: 500 mg via INTRAMUSCULAR

## 2021-06-02 MED ORDER — METRONIDAZOLE 500 MG PO TABS: 500.0000 mg | ORAL_TABLET | Freq: Two times a day (BID) | ORAL | 2 refills | Status: DC

## 2021-06-10 ENCOUNTER — Other Ambulatory Visit: Payer: Self-pay

## 2021-06-10 ENCOUNTER — Telehealth: Payer: Medicaid Other | Admitting: Obstetrics

## 2021-07-01 ENCOUNTER — Ambulatory Visit (INDEPENDENT_AMBULATORY_CARE_PROVIDER_SITE_OTHER): Payer: Medicaid Other | Admitting: *Deleted

## 2021-07-01 ENCOUNTER — Other Ambulatory Visit (HOSPITAL_COMMUNITY)
Admission: RE | Admit: 2021-07-01 | Discharge: 2021-07-01 | Disposition: A | Discharge status: Home / Self Care | Payer: Medicaid Other | Source: Ambulatory Visit | Attending: Obstetrics and Gynecology | Admitting: Obstetrics and Gynecology

## 2021-07-01 ENCOUNTER — Other Ambulatory Visit: Payer: Self-pay

## 2021-07-01 DIAGNOSIS — Z8619 Personal history of other infectious and parasitic diseases: Secondary | ICD-10-CM | POA: Insufficient documentation

## 2021-07-01 DIAGNOSIS — N898 Other specified noninflammatory disorders of vagina: Secondary | ICD-10-CM | POA: Diagnosis not present

## 2021-07-01 DIAGNOSIS — Z113 Encounter for screening for infections with a predominantly sexual mode of transmission: Secondary | ICD-10-CM

## 2021-07-01 NOTE — Progress Notes (Signed)
SUBJECTIVE:  ?26 y.o. female complains of clear and thin vaginal discharge since last visit.  ?Denies abnormal vaginal bleeding or significant pelvic pain or ?fever. No UTI symptoms.  ?Pt request full panel swab today.  ? ?OBJECTIVE:  ?She appears well, afebrile. ?Urine dipstick: not done. ? ?ASSESSMENT:  ?Vaginal Discharge  ?Recent STD treatment ? ? ?PLAN:  ?GC, chlamydia, trichomonas, BVAG, CVAG probe sent to lab. ?Treatment: To be determined once lab results are received ?ROV prn if symptoms persist or worsen. ? ?

## 2021-07-02 ENCOUNTER — Other Ambulatory Visit: Payer: Self-pay

## 2021-07-02 DIAGNOSIS — B9689 Other specified bacterial agents as the cause of diseases classified elsewhere: Secondary | ICD-10-CM

## 2021-07-02 LAB — CERVICOVAGINAL ANCILLARY ONLY
Bacterial Vaginitis (gardnerella): POSITIVE — AB
Candida Glabrata: NEGATIVE
Candida Vaginitis: NEGATIVE
Chlamydia: NEGATIVE
Comment: NEGATIVE
Comment: NEGATIVE
Comment: NEGATIVE
Comment: NEGATIVE
Comment: NEGATIVE
Comment: NORMAL
Neisseria Gonorrhea: NEGATIVE
Trichomonas: NEGATIVE

## 2021-07-02 MED ORDER — METRONIDAZOLE 500 MG PO TABS: 500.0000 mg | ORAL_TABLET | Freq: Two times a day (BID) | ORAL | 0 refills | Status: DC

## 2021-07-14 ENCOUNTER — Ambulatory Visit: Payer: Medicaid Other | Admitting: Obstetrics

## 2021-07-23 ENCOUNTER — Ambulatory Visit: Payer: Medicaid Other | Admitting: Obstetrics

## 2021-08-04 ENCOUNTER — Emergency Department (HOSPITAL_COMMUNITY)
Admission: EM | Admit: 2021-08-04 | Discharge: 2021-08-04 | Disposition: A | Discharge status: Home / Self Care | Payer: Medicaid Other | Attending: Emergency Medicine | Admitting: Emergency Medicine

## 2021-08-04 ENCOUNTER — Encounter (HOSPITAL_COMMUNITY): Payer: Self-pay | Admitting: Emergency Medicine

## 2021-08-04 DIAGNOSIS — L0390 Cellulitis, unspecified: Secondary | ICD-10-CM

## 2021-08-04 DIAGNOSIS — L02411 Cutaneous abscess of right axilla: Secondary | ICD-10-CM | POA: Insufficient documentation

## 2021-08-04 DIAGNOSIS — R590 Localized enlarged lymph nodes: Secondary | ICD-10-CM | POA: Insufficient documentation

## 2021-08-04 MED ORDER — DOXYCYCLINE HYCLATE 100 MG PO CAPS: 100.0000 mg | ORAL_CAPSULE | Freq: Two times a day (BID) | ORAL | 0 refills | Status: DC

## 2021-08-04 NOTE — Discharge Instructions (Signed)
Warm compresses for 20 minutes at a time to affected areas.  Take antibiotics as prescribed and complete the full course.  Recheck with your primary care provider in 2 days ?

## 2021-08-04 NOTE — ED Provider Notes (Signed)
?[facility]-EMERGENCY DEPT ?Provider Note ? ? ?CSN: [id number] ?Arrival date & time: 08/04/21  1849 ? ?  ? ?History ? ?Chief Complaint  ?Patient presents with  ? Abscess  ? ? ?Nicole Woodard is a 26 y.o. female. ? ?Nicole Woodard , a 26 y.o. female  was evaluated in triage.  Pt complains of abscess under right arm, red spot on right buttock, left groin hair bump after a wax last week. ? ? ?  ? ?Home Medications ?Prior to Admission medications   ?Medication Sig Start Date End Date Taking? Authorizing Provider  ?doxycycline (VIBRAMYCIN) 100 MG capsule Take 1 capsule (100 mg total) by mouth 2 (two) times daily. 08/04/21  Yes Jeannie Fend, PA-C  ?Norethindrone Acetate-Ethinyl Estrad-FE (AUROVELA 24 FE) 1-20 MG-MCG(24) tablet Take 1 tablet by mouth daily. 02/19/21   Brock Bad, MD  ?   ? ?Allergies    ?Patient has no known allergies.   ? ?Review of Systems   ?Review of Systems ?Negative except as per HPI ?Physical Exam ?Updated Vital Signs ?BP (!) 138/95 (BP Location: Right Arm)   Pulse (!) 111   Temp 99.3 ?F (37.4 ?C) (Oral)   Resp 18   Ht 5' 5.5" (1.664 m)   Wt 103.9 kg   SpO2 100%   BMI 37.53 kg/m?  ?Physical Exam ?Vitals and nursing note reviewed.  ?Constitutional:   ?   General: She is not in acute distress. ?   Appearance: She is well-developed. She is not diaphoretic.  ?HENT:  ?   Head: Normocephalic and atraumatic.  ?Pulmonary:  ?   Effort: Pulmonary effort is normal.  ?Abdominal:  ?   Palpations: Abdomen is soft.  ?   Tenderness: There is no abdominal tenderness.  ?Musculoskeletal:     ?   General: Normal range of motion.  ?Lymphadenopathy:  ?   Upper Body:  ?   Right upper body: Axillary adenopathy present.  ?   Comments: Approximately 2 cm rubbery, mobile, tender lymph node to right axillary area, no overlying erythema, no fluctuance, suspect lymph node and not abscess.,  ?Skin: ?   General: Skin is warm and dry.  ?   Comments: Approximate 2 cm x 3 cm area of erythema  with induration to left groin, no sexual fluctuance, no drainage, no streaking. ?Approximately 2 cm x 2 cm area of erythema with induration to right buttock near midline, no fluctuance, no drainage, no streaking.  ?Neurological:  ?   Mental Status: She is alert and oriented to person, place, and time.  ?   Sensory: No sensory deficit.  ?   Motor: No weakness.  ?Psychiatric:     ?   Behavior: Behavior normal.  ? ? ?ED Results / Procedures / Treatments   ?Labs ?(all labs ordered are listed, but only abnormal results are displayed) ?Labs Reviewed - No data to display ? ?EKG ?None ? ?Radiology ?No results found. ? ?Procedures ?Procedures  ? ? ?Medications Ordered in ED ?Medications - No data to display ? ?ED Course/ Medical Decision Making/ A&P ?  ?                        ?Medical Decision Making ?Risk ?Prescription drug management. ? ? ?26 year old female presents with concern for multiple abscess as above.  Exam of right axillary area found to have a tender, mobile, rubbery mass suspect most likely lymph node.  Patient denies any breast complaints,  states that she is up-to-date with her annual physical with no breast concerns at her visit.  Patient is to monitor this area, follow-up with her primary care provider.  She can apply warm compresses.  Discussed proper monitoring and recheck if area persists. ?Concern for cellulitis in the left groin and right buttocks areas.  No active fluctuance, drainage, streaking from these areas, doubt drainable collection.  ?Recommend Doxycycline, warm compresses, recheck.  ? ? ? ? ? ? ? ?Final Clinical Impression(s) / ED Diagnoses ?Final diagnoses:  ?Cellulitis, unspecified cellulitis site  ?Lymphadenopathy, axillary  ? ? ?Rx / DC Orders ?ED Discharge Orders   ? ?      Ordered  ?  doxycycline (VIBRAMYCIN) 100 MG capsule  2 times daily       ? 08/04/21 1913  ? ?  ?  ? ?  ? ? ?  ?Jeannie Fend, PA-C ?08/04/21 2219 ? ?  ?Terrilee Files, MD ?08/05/21 [phone] ? ?

## 2021-08-04 NOTE — ED Triage Notes (Signed)
Per pt, states possible abscess under right arm and right groin-noticed a couple of days ago ?

## 2021-09-17 ENCOUNTER — Ambulatory Visit: Payer: Medicaid Other

## 2021-09-24 ENCOUNTER — Ambulatory Visit: Payer: Medicaid Other

## 2021-10-01 ENCOUNTER — Ambulatory Visit: Payer: Medicaid Other

## 2021-10-08 ENCOUNTER — Ambulatory Visit: Payer: Medicaid Other

## 2021-10-12 ENCOUNTER — Ambulatory Visit: Payer: Medicaid Other

## 2021-10-15 ENCOUNTER — Other Ambulatory Visit (HOSPITAL_COMMUNITY)
Admission: RE | Admit: 2021-10-15 | Discharge: 2021-10-15 | Disposition: A | Discharge status: Home / Self Care | Payer: Medicaid Other | Source: Ambulatory Visit | Attending: Obstetrics | Admitting: Obstetrics

## 2021-10-15 ENCOUNTER — Ambulatory Visit (INDEPENDENT_AMBULATORY_CARE_PROVIDER_SITE_OTHER): Payer: Self-pay

## 2021-10-15 DIAGNOSIS — A5402 Gonococcal vulvovaginitis, unspecified: Secondary | ICD-10-CM | POA: Insufficient documentation

## 2021-10-15 DIAGNOSIS — N898 Other specified noninflammatory disorders of vagina: Secondary | ICD-10-CM | POA: Diagnosis present

## 2021-10-15 DIAGNOSIS — N760 Acute vaginitis: Secondary | ICD-10-CM | POA: Insufficient documentation

## 2021-10-15 DIAGNOSIS — B9689 Other specified bacterial agents as the cause of diseases classified elsewhere: Secondary | ICD-10-CM | POA: Diagnosis not present

## 2021-10-15 DIAGNOSIS — Z113 Encounter for screening for infections with a predominantly sexual mode of transmission: Secondary | ICD-10-CM | POA: Insufficient documentation

## 2021-10-15 NOTE — Progress Notes (Signed)
..  SUBJECTIVE:  26 y.o. female complains of clear vaginal discharge for 3 day(s). Denies abnormal vaginal bleeding or significant pelvic pain or fever. No UTI symptoms. Denies history of known exposure to STD.  No LMP recorded.  OBJECTIVE:  She appears well, afebrile. Urine dipstick: not done.  ASSESSMENT:  Vaginal Discharge  Vaginal Odor   PLAN:  GC, chlamydia, trichomonas, BVAG, CVAG probe sent to lab. Treatment: To be determined once lab results are received ROV prn if symptoms persist or worsen.

## 2021-10-16 LAB — HEPATITIS B SURFACE ANTIGEN: Hepatitis B Surface Ag: NEGATIVE

## 2021-10-16 LAB — HEPATITIS C ANTIBODY: Hep C Virus Ab: REACTIVE — AB

## 2021-10-16 LAB — CERVICOVAGINAL ANCILLARY ONLY
Bacterial Vaginitis (gardnerella): POSITIVE — AB
Candida Glabrata: NEGATIVE
Candida Vaginitis: NEGATIVE
Chlamydia: NEGATIVE
Comment: NEGATIVE
Comment: NEGATIVE
Comment: NEGATIVE
Comment: NEGATIVE
Comment: NEGATIVE
Comment: NORMAL
Neisseria Gonorrhea: POSITIVE — AB
Trichomonas: NEGATIVE

## 2021-10-16 LAB — RPR: RPR Ser Ql: NONREACTIVE

## 2021-10-16 LAB — HIV ANTIBODY (ROUTINE TESTING W REFLEX): HIV Screen 4th Generation wRfx: NONREACTIVE

## 2021-10-17 ENCOUNTER — Encounter: Payer: Self-pay | Admitting: Obstetrics

## 2021-10-19 ENCOUNTER — Other Ambulatory Visit: Payer: Self-pay | Admitting: *Deleted

## 2021-10-19 ENCOUNTER — Ambulatory Visit (INDEPENDENT_AMBULATORY_CARE_PROVIDER_SITE_OTHER): Payer: Medicaid Other

## 2021-10-19 ENCOUNTER — Other Ambulatory Visit: Payer: Medicaid Other

## 2021-10-19 DIAGNOSIS — B9689 Other specified bacterial agents as the cause of diseases classified elsewhere: Secondary | ICD-10-CM

## 2021-10-19 DIAGNOSIS — A549 Gonococcal infection, unspecified: Secondary | ICD-10-CM

## 2021-10-19 DIAGNOSIS — Z3041 Encounter for surveillance of contraceptive pills: Secondary | ICD-10-CM

## 2021-10-19 DIAGNOSIS — B1920 Unspecified viral hepatitis C without hepatic coma: Secondary | ICD-10-CM

## 2021-10-19 MED ORDER — CEFTRIAXONE SODIUM 500 MG IJ SOLR
500.0000 mg | Freq: Once | INTRAMUSCULAR | Status: AC
  Administered 2021-10-19: 500 mg via INTRAMUSCULAR

## 2021-10-19 MED ORDER — CEFTRIAXONE SODIUM 500 MG IJ SOLR: 500.0000 mg | Freq: Once | INTRAMUSCULAR | Status: DC

## 2021-10-19 MED ORDER — METRONIDAZOLE 0.75 % VA GEL: 1.0000 | Freq: Every day | VAGINAL | 0 refills | Status: DC

## 2021-10-19 NOTE — Progress Notes (Signed)
Pt is in the office for rocephin injection, administered in RUOQ and pt tolerated well. Pt also had blood work done today to follow up on positive Hep C results from 10/15/21 .Marland Kitchen Administrations This Visit     cefTRIAXone (ROCEPHIN) injection 500 mg     Admin Date 10/19/2021 Action Given Dose 500 mg Route Intramuscular Administered By Katrina Stack, RN

## 2021-10-19 NOTE — Progress Notes (Signed)
TC from pt regarding positive GC, BV, and reactive Hep C. Dr. Donavan Foil was consulted. TX GC, BV per protocol. Lab only visit HCV RNA quant. If positive/ high Dr. Donavan Foil will refer to infectious disease provider. Returned TC to pt. Informed of above. Appt for Rocephin to treat GC and lab visit for HCV RNA quant today. RX sent for BV. Pt cautioned about high risk behavior. Verbalized understanding.

## 2021-10-20 LAB — HCV RNA QUANT: Hepatitis C Quantitation: NOT DETECTED IU/mL

## 2021-10-22 ENCOUNTER — Other Ambulatory Visit: Payer: Self-pay | Admitting: Obstetrics and Gynecology

## 2021-10-22 DIAGNOSIS — R768 Other specified abnormal immunological findings in serum: Secondary | ICD-10-CM

## 2021-10-22 NOTE — Progress Notes (Signed)
Referral to ID placed for Hep C antibody positive

## 2021-10-23 ENCOUNTER — Telehealth: Payer: Self-pay | Admitting: Emergency Medicine

## 2021-11-06 ENCOUNTER — Ambulatory Visit: Payer: Medicaid Other

## 2021-11-12 ENCOUNTER — Ambulatory Visit: Payer: Medicaid Other

## 2022-02-11 ENCOUNTER — Other Ambulatory Visit (HOSPITAL_COMMUNITY)
Admission: RE | Admit: 2022-02-11 | Discharge: 2022-02-11 | Disposition: A | Discharge status: Home / Self Care | Payer: Medicaid Other | Source: Ambulatory Visit | Attending: Obstetrics | Admitting: Obstetrics

## 2022-02-11 ENCOUNTER — Ambulatory Visit (INDEPENDENT_AMBULATORY_CARE_PROVIDER_SITE_OTHER): Payer: Medicaid Other | Admitting: Obstetrics

## 2022-02-11 ENCOUNTER — Encounter: Payer: Self-pay | Admitting: Obstetrics

## 2022-02-11 VITALS — BP 113/72 | HR 95 | Ht 65.5 in | Wt 228.0 lb

## 2022-02-11 DIAGNOSIS — R300 Dysuria: Secondary | ICD-10-CM

## 2022-02-11 DIAGNOSIS — A549 Gonococcal infection, unspecified: Secondary | ICD-10-CM | POA: Diagnosis present

## 2022-02-11 DIAGNOSIS — N898 Other specified noninflammatory disorders of vagina: Secondary | ICD-10-CM

## 2022-02-11 DIAGNOSIS — Z3041 Encounter for surveillance of contraceptive pills: Secondary | ICD-10-CM

## 2022-02-11 MED ORDER — METRONIDAZOLE 0.75 % VA GEL: 1.0000 | Freq: Every day | VAGINAL | 0 refills | Status: DC

## 2022-02-11 MED ORDER — AZITHROMYCIN 500 MG PO TABS
1000.0000 mg | ORAL_TABLET | Freq: Once | ORAL | Status: AC
  Administered 2022-02-11: 1000 mg via ORAL

## 2022-02-11 MED ORDER — AUROVELA 24 FE 1-20 MG-MCG(24) PO TABS: 1.0000 | ORAL_TABLET | Freq: Every day | ORAL | 11 refills | Status: DC

## 2022-02-11 MED ORDER — FLUCONAZOLE 150 MG PO TABS: 150.0000 mg | ORAL_TABLET | Freq: Once | ORAL | 0 refills | Status: AC

## 2022-02-11 MED ORDER — CEFTRIAXONE SODIUM 250 MG IJ SOLR: 250.0000 mg | Freq: Once | INTRAMUSCULAR | Status: DC

## 2022-02-11 NOTE — Progress Notes (Signed)
Patient ID: Nicole Woodard, female   DOB: [birth year], 26 y.o.   MRN: [id number]  Chief Complaint  Patient presents with   Gynecologic Exam    HPI NIKKIA DEVOSS is a 26 y.o. female.  Presents for GC TOC.  She says that she may have been re-infected recently because her partner told her to be tested. HPI  Past Medical History:  Diagnosis Date   Medical history non-contributory     Past Surgical History:  Procedure Laterality Date   NO PAST SURGERIES      Family History  Problem Relation Age of Onset   Hypertension Other    Cancer Other     Social History Social History   Tobacco Use   Smoking status: Former    Types: Cigarettes    Quit date: 2016    Years since quitting: 7.7    Passive exposure: Past   Smokeless tobacco: Never  Vaping Use   Vaping Use: Never used  Substance Use Topics   Alcohol use: Yes    Comment: occ   Drug use: No    No Known Allergies  Current Outpatient Medications  Medication Sig Dispense Refill   fluconazole (DIFLUCAN) 150 MG tablet Take 1 tablet (150 mg total) by mouth once for 1 dose. 1 tablet 0   doxycycline (VIBRAMYCIN) 100 MG capsule Take 1 capsule (100 mg total) by mouth 2 (two) times daily. (Patient not taking: Reported on 10/15/2021) 20 capsule 0   metroNIDAZOLE (METROGEL VAGINAL) 0.75 % vaginal gel Place 1 Applicatorful vaginally at bedtime. 70 g 0   Norethindrone Acetate-Ethinyl Estrad-FE (AUROVELA 24 FE) 1-20 MG-MCG(24) tablet Take 1 tablet by mouth daily. 28 tablet 11   Current Facility-Administered Medications  Medication Dose Route Frequency Provider Last Rate Last Admin   azithromycin (ZITHROMAX) tablet 1,000 mg  1,000 mg Oral Once Brock Bad, MD       cefTRIAXone (ROCEPHIN) injection 250 mg  250 mg Intramuscular Once Brock Bad, MD       cefTRIAXone (ROCEPHIN) injection 500 mg  500 mg Intramuscular Once Brock Bad, MD        Review of Systems Review of Systems Constitutional: negative for  fatigue and weight loss Respiratory: negative for cough and wheezing Cardiovascular: negative for chest pain, fatigue and palpitations Gastrointestinal: negative for abdominal pain and change in bowel habits Genitourinary:positive for vaginal discharge and burning with urination Integument/breast: negative for nipple discharge Musculoskeletal:negative for myalgias Neurological: negative for gait problems and tremors Behavioral/Psych: negative for abusive relationship, depression Endocrine: negative for temperature intolerance      Blood pressure 113/72, pulse 95, height 5' 5.5" (1.664 m), weight 228 lb (103.4 kg), last menstrual period 02/09/2022.  Physical Exam Physical Exam General:   Alert and no distress  Skin:   no rash or abnormalities  Lungs:   clear to auscultation bilaterally  Heart:   regular rate and rhythm, S1, S2 normal, no murmur, click, rub or gallop  Breasts:   normal without suspicious masses, skin or nipple changes or axillary nodes  Abdomen:  normal findings: no organomegaly, soft, non-tender and no hernia  Pelvis:  External genitalia: normal general appearance Urinary system: urethral meatus normal and bladder without fullness, nontender Vaginal: normal without tenderness, induration or masses Cervix: normal appearance Adnexa: normal bimanual exam Uterus: anteverted and non-tender, normal size    I have spent a total of 20 minutes of face-to-face and non-face-to-face time, excluding clinical staff time, reviewing notes and preparing to see  patient, ordering tests and/or medications, and counseling the patient.   Data Reviewed Labs  Assessment     1. GC (gonococcus infection) Rx: - cefTRIAXone (ROCEPHIN) injection 250 mg - azithromycin (ZITHROMAX) tablet 1,000 mg - Cervicovaginal ancillary only  2. Vaginal discharge Rx: - metroNIDAZOLE (METROGEL VAGINAL) 0.75 % vaginal gel; Place 1 Applicatorful vaginally at bedtime.  Dispense: 70 g; Refill: 0 -  fluconazole (DIFLUCAN) 150 MG tablet; Take 1 tablet (150 mg total) by mouth once for 1 dose.  Dispense: 1 tablet; Refill: 0  3. Encounter for surveillance of contraceptive pills Rx: - Norethindrone Acetate-Ethinyl Estrad-FE (AUROVELA 24 FE) 1-20 MG-MCG(24) tablet; Take 1 tablet by mouth daily.  Dispense: 28 tablet; Refill: 11 - Hepatitis B surface antigen - Hepatitis C antibody - RPR - HIV Antibody (routine testing w rflx)  4. Dysuria Rx: - Urinalysis, dipstick only; Future     Plan Follow up in 2 weeks virtually    Orders Placed This Encounter  Procedures   Urinalysis, dipstick only    Standing Status:   Future    Standing Expiration Date:   02/12/2023   Meds ordered this encounter  Medications   cefTRIAXone (ROCEPHIN) injection 250 mg   azithromycin (ZITHROMAX) tablet 1,000 mg   Norethindrone Acetate-Ethinyl Estrad-FE (AUROVELA 24 FE) 1-20 MG-MCG(24) tablet    Sig: Take 1 tablet by mouth daily.    Dispense:  28 tablet    Refill:  11   metroNIDAZOLE (METROGEL VAGINAL) 0.75 % vaginal gel    Sig: Place 1 Applicatorful vaginally at bedtime.    Dispense:  70 g    Refill:  0   fluconazole (DIFLUCAN) 150 MG tablet    Sig: Take 1 tablet (150 mg total) by mouth once for 1 dose.    Dispense:  1 tablet    Refill:  0     Verita Schneiders, MD, FACOG Attending [place], [facility] for [facility], [place], [place] 02-11-22

## 2022-02-11 NOTE — Progress Notes (Signed)
TOC GC. Pap is not due until 02/19/22 Pt requests txt for STD. She was possibly exposed to an STD but they did not tell her which STD.  Rocephin and Azithromycin given presumptively today. Pt has questions regarding hyperpigment in the genital area.

## 2022-02-12 LAB — HEPATITIS C ANTIBODY: Hep C Virus Ab: REACTIVE — AB

## 2022-02-12 LAB — HIV ANTIBODY (ROUTINE TESTING W REFLEX): HIV Screen 4th Generation wRfx: NONREACTIVE

## 2022-02-12 LAB — HEPATITIS B SURFACE ANTIGEN: Hepatitis B Surface Ag: NEGATIVE

## 2022-02-12 LAB — RPR: RPR Ser Ql: NONREACTIVE

## 2022-02-16 ENCOUNTER — Other Ambulatory Visit (HOSPITAL_COMMUNITY): Payer: Self-pay

## 2022-02-16 ENCOUNTER — Telehealth: Payer: Self-pay

## 2022-02-16 NOTE — Telephone Encounter (Signed)
[facility] Patient Advocate Encounter ? ?Insurance verification completed.   ? ?The patient is insured through [place] [employer]. ? ?Medication will need a PA ? ?We will continue to follow to see if copay assistance is needed. ? ?Nicanor Mendolia, CPhT ?Specialty Pharmacy Patient Advocate ?[facility] ?Phone: [phone] ?Fax:  [phone]  ?

## 2022-02-18 ENCOUNTER — Encounter: Payer: Self-pay | Admitting: Obstetrics

## 2022-02-18 ENCOUNTER — Encounter: Payer: Medicaid Other | Admitting: Infectious Diseases

## 2022-02-19 ENCOUNTER — Encounter: Payer: Self-pay | Admitting: Obstetrics

## 2022-02-19 LAB — CERVICOVAGINAL ANCILLARY ONLY
Bacterial Vaginitis (gardnerella): POSITIVE — AB
Candida Glabrata: NEGATIVE
Candida Vaginitis: POSITIVE — AB
Chlamydia: NEGATIVE
Comment: NEGATIVE
Comment: NEGATIVE
Comment: NEGATIVE
Comment: NEGATIVE
Comment: NEGATIVE
Comment: NORMAL
Neisseria Gonorrhea: NEGATIVE
Trichomonas: POSITIVE — AB

## 2022-02-22 ENCOUNTER — Other Ambulatory Visit: Payer: Self-pay | Admitting: Emergency Medicine

## 2022-02-22 DIAGNOSIS — B379 Candidiasis, unspecified: Secondary | ICD-10-CM

## 2022-02-22 DIAGNOSIS — A599 Trichomoniasis, unspecified: Secondary | ICD-10-CM

## 2022-02-22 DIAGNOSIS — N760 Acute vaginitis: Secondary | ICD-10-CM

## 2022-02-22 MED ORDER — METRONIDAZOLE 500 MG PO TABS: 500.0000 mg | ORAL_TABLET | Freq: Two times a day (BID) | ORAL | 0 refills | Status: DC

## 2022-02-22 MED ORDER — FLUCONAZOLE 150 MG PO TABS: 150.0000 mg | ORAL_TABLET | Freq: Once | ORAL | 0 refills | Status: AC

## 2022-02-22 NOTE — Progress Notes (Signed)
Rx sent to pharmacy   

## 2022-02-23 ENCOUNTER — Ambulatory Visit: Payer: Medicaid Other | Admitting: Obstetrics

## 2022-02-24 ENCOUNTER — Telehealth: Payer: Self-pay

## 2022-02-24 ENCOUNTER — Other Ambulatory Visit: Payer: Self-pay | Admitting: Obstetrics

## 2022-02-24 DIAGNOSIS — B379 Candidiasis, unspecified: Secondary | ICD-10-CM

## 2022-02-24 MED ORDER — FLUCONAZOLE 150 MG PO TABS: 150.0000 mg | ORAL_TABLET | Freq: Once | ORAL | 0 refills | Status: AC

## 2022-02-24 NOTE — Telephone Encounter (Signed)
Attempted to contact about results and rx, no answer, vm full

## 2022-04-07 ENCOUNTER — Ambulatory Visit: Payer: Medicaid Other | Admitting: Obstetrics

## 2022-05-05 ENCOUNTER — Ambulatory Visit: Payer: Medicaid Other | Admitting: Obstetrics

## 2022-05-12 ENCOUNTER — Ambulatory Visit: Payer: Medicaid Other | Admitting: Obstetrics

## 2022-05-15 ENCOUNTER — Encounter (HOSPITAL_COMMUNITY): Payer: Self-pay | Admitting: *Deleted

## 2022-05-15 ENCOUNTER — Emergency Department (HOSPITAL_COMMUNITY)
Admission: EM | Admit: 2022-05-15 | Discharge: 2022-05-15 | Disposition: A | Discharge status: Home / Self Care | Payer: Medicaid Other | Attending: Emergency Medicine | Admitting: Emergency Medicine

## 2022-05-15 ENCOUNTER — Other Ambulatory Visit: Payer: Self-pay

## 2022-05-15 DIAGNOSIS — N764 Abscess of vulva: Secondary | ICD-10-CM | POA: Insufficient documentation

## 2022-05-15 DIAGNOSIS — L0291 Cutaneous abscess, unspecified: Secondary | ICD-10-CM

## 2022-05-15 LAB — CBC WITH DIFFERENTIAL/PLATELET
Abs Immature Granulocytes: 0.14 10*3/uL — ABNORMAL HIGH (ref 0.00–0.07)
Basophils Absolute: 0.1 10*3/uL (ref 0.0–0.1)
Basophils Relative: 1 %
Eosinophils Absolute: 0.6 10*3/uL — ABNORMAL HIGH (ref 0.0–0.5)
Eosinophils Relative: 3 %
HCT: 38 % (ref 36.0–46.0)
Hemoglobin: 12.6 g/dL (ref 12.0–15.0)
Immature Granulocytes: 1 %
Lymphocytes Relative: 13 %
Lymphs Abs: 2.5 10*3/uL (ref 0.7–4.0)
MCH: 27.8 pg (ref 26.0–34.0)
MCHC: 33.2 g/dL (ref 30.0–36.0)
MCV: 83.7 fL (ref 80.0–100.0)
Monocytes Absolute: 1.2 10*3/uL — ABNORMAL HIGH (ref 0.1–1.0)
Monocytes Relative: 6 %
Neutro Abs: 15.5 10*3/uL — ABNORMAL HIGH (ref 1.7–7.7)
Neutrophils Relative %: 76 %
Platelets: 374 10*3/uL (ref 150–400)
RBC: 4.54 MIL/uL (ref 3.87–5.11)
RDW: 14.3 % (ref 11.5–15.5)
WBC: 20.1 10*3/uL — ABNORMAL HIGH (ref 4.0–10.5)
nRBC: 0 % (ref 0.0–0.2)

## 2022-05-15 LAB — BASIC METABOLIC PANEL
Anion gap: 12 (ref 5–15)
BUN: 8 mg/dL (ref 6–20)
CO2: 22 mmol/L (ref 22–32)
Calcium: 9.1 mg/dL (ref 8.9–10.3)
Chloride: 101 mmol/L (ref 98–111)
Creatinine, Ser: 0.63 mg/dL (ref 0.44–1.00)
GFR, Estimated: >60 mL/min (ref >60)
Glucose, Bld: 97 mg/dL (ref 70–99)
Potassium: 3.3 mmol/L — ABNORMAL LOW (ref 3.5–5.1)
Sodium: 135 mmol/L (ref 135–145)

## 2022-05-15 MED ORDER — ACETAMINOPHEN 500 MG PO TABS
1000.0000 mg | ORAL_TABLET | Freq: Once | ORAL | Status: AC
Start: 2022-05-15 — End: 2022-05-15
  Administered 2022-05-15: 1000 mg via ORAL
  Filled 2022-05-15: qty 2

## 2022-05-15 MED ORDER — DOXYCYCLINE HYCLATE 100 MG PO CAPS: 100.0000 mg | ORAL_CAPSULE | Freq: Two times a day (BID) | ORAL | 0 refills | Status: DC

## 2022-05-15 MED ORDER — DOXYCYCLINE HYCLATE 100 MG PO TABS
100.0000 mg | ORAL_TABLET | Freq: Once | ORAL | Status: AC
  Administered 2022-05-15: 100 mg via ORAL
  Filled 2022-05-15: qty 1

## 2022-05-15 MED ORDER — LIDOCAINE-EPINEPHRINE (PF) 2 %-1:200000 IJ SOLN
10.0000 mL | Freq: Once | INTRAMUSCULAR | Status: AC
  Administered 2022-05-15: 10 mL
  Filled 2022-05-15: qty 20

## 2022-05-15 NOTE — ED Notes (Signed)
Pt stated she was leaving AMA due to wait 

## 2022-05-15 NOTE — ED Triage Notes (Signed)
Pt states she had a [place] wax a few days ago states has abscess in the area now

## 2022-05-15 NOTE — Discharge Instructions (Signed)
Return to ED with any new symptoms such as fevers, redness around incision site Please return to ED, urgent care or PCP in 2 days for packing removal and wound check as discussed Please begin taking doxycycline twice daily.  Please be aware that this will make you susceptible to sunburns. Please attach got concerning skin abscesses

## 2022-05-15 NOTE — ED Triage Notes (Signed)
The pt has an abscess on her vagina for 3 days no temp no previous history  lmp jan 1st

## 2022-05-15 NOTE — ED Provider Notes (Signed)
[facility] DEPT Provider Note   CSN: [id number] Arrival date & time: 05/15/22  1013     History  Chief Complaint  Patient presents with   Abscess    Nicole Woodard is a 27 y.o. female with no documented medical history.  Patient presents to ED for evaluation of abscess.  Patient reports that 2 to 3 days ago she noticed a red area around her vagina that has progressively worsened since this time.  The patient reports that she recently received [place] wax, believes that an ingrown hair is the cause of her abscess.  The patient denies any fevers, nausea or vomiting.  The patient states she has been applying warm compresses to the area with no real relief of symptoms.   Abscess Associated symptoms: no fever, no nausea and no vomiting        Home Medications Prior to Admission medications   Medication Sig Start Date End Date Taking? Authorizing Provider  doxycycline (VIBRAMYCIN) 100 MG capsule Take 1 capsule (100 mg total) by mouth 2 (two) times daily. 05/15/22  Yes Azucena Cecil, PA-C  metroNIDAZOLE (FLAGYL) 500 MG tablet Take 1 tablet (500 mg total) by mouth 2 (two) times daily. 02/22/22   Constant, Peggy, MD  metroNIDAZOLE (METROGEL VAGINAL) 0.75 % vaginal gel Place 1 Applicatorful vaginally at bedtime. 02/11/22   Shelly Bombard, MD  Norethindrone Acetate-Ethinyl Estrad-FE (AUROVELA 24 FE) 1-20 MG-MCG(24) tablet Take 1 tablet by mouth daily. 02/11/22   Shelly Bombard, MD      Allergies    Patient has no known allergies.    Review of Systems   Review of Systems  Constitutional:  Negative for fever.  Gastrointestinal:  Negative for nausea and vomiting.  Skin:        Abscess  All other systems reviewed and are negative.   Physical Exam Updated Vital Signs BP 116/80 (BP Location: Right Arm)   Pulse 85   Temp 98.4 F (36.9 C) (Oral)   Resp 16   Ht 5\' 6"  (1.676 m)   Wt 100 kg   SpO2 98%   BMI 35.58 kg/m  Physical  Exam Vitals and nursing note reviewed. Exam conducted with a chaperone present.  Constitutional:      General: She is not in acute distress.    Appearance: Normal appearance. She is not ill-appearing, toxic-appearing or diaphoretic.  HENT:     Head: Normocephalic and atraumatic.     Nose: Nose normal. No congestion.     Mouth/Throat:     Mouth: Mucous membranes are moist.     Pharynx: Oropharynx is clear.  Eyes:     Extraocular Movements: Extraocular movements intact.     Conjunctiva/sclera: Conjunctivae normal.     Pupils: Pupils are equal, round, and reactive to light.  Cardiovascular:     Rate and Rhythm: Normal rate and regular rhythm.  Pulmonary:     Effort: Pulmonary effort is normal.     Breath sounds: Normal breath sounds. No wheezing.  Abdominal:     General: Abdomen is flat. Bowel sounds are normal.     Palpations: Abdomen is soft.     Tenderness: There is no abdominal tenderness.  Musculoskeletal:     Cervical back: Normal range of motion and neck supple. No tenderness.  Skin:    General: Skin is warm and dry.     Capillary Refill: Capillary refill takes less than 2 seconds.     Findings: Abscess present.  Comments: 2x2cm area of induration, erythema. No drainage  Neurological:     Mental Status: She is alert and oriented to person, place, and time.     ED Results / Procedures / Treatments   Labs (all labs ordered are listed, but only abnormal results are displayed) Labs Reviewed - No data to display  EKG None  Radiology No results found.  Procedures .Marland KitchenIncision and Drainage  Date/Time: 05/15/2022 1:28 PM  Performed by: Azucena Cecil, PA-C Authorized by: Azucena Cecil, PA-C   Consent:    Consent obtained:  Verbal   Consent given by:  Patient   Risks, benefits, and alternatives were discussed: yes     Risks discussed:  Bleeding, damage to other organs, infection, incomplete drainage and pain   Alternatives discussed:  No  treatment Universal protocol:    Patient identity confirmed:  Verbally with patient and arm band Location:    Type:  Abscess   Size:  2x2   Location:  Anogenital Pre-procedure details:    Skin preparation:  Povidone-iodine Sedation:    Sedation type:  None Anesthesia:    Anesthesia method:  Local infiltration   Local anesthetic:  Lidocaine 2% WITH epi Procedure type:    Complexity:  Simple Procedure details:    Incision types:  Stab incision   Wound management:  Probed and deloculated and irrigated with saline   Drainage:  Bloody and purulent   Packing materials:  1/4 in iodoform gauze Post-procedure details:    Procedure completion:  Tolerated well, no immediate complications    Medications Ordered in ED Medications  lidocaine-EPINEPHrine (XYLOCAINE W/EPI) 2 %-1:200000 (PF) injection 10 mL (10 mLs Other Given 05/15/22 1301)    ED Course/ Medical Decision Making/ A&P                          Medical Decision Making  27 year old female presents to the ED for evaluation of abscess.  Please see HPI for further details.  On examination the patient is afebrile, nontachycardic.  Patient on sounds clear bilaterally, not hypoxic.  Patient does have a 2 x 2 centimeter area of induration, erythema to her left anogenital area.  Patient had this area incised and drained per procedure note.  The patient tolerated the procedure well.  The patient had packing placed.  The patient will be informed to return to the ED, urgent care or her PCP in 2 days for wound check and packing removal.  Patient will be sent home on 7 days of doxycycline twice daily.  Patient was given strict return precautions, she was advised and educated on signs and symptoms of infection.  The patient voiced understanding of my instructions.  Patient had all questions answered her satisfaction.  The patient is stable for discharge.  Final Clinical Impression(s) / ED Diagnoses Final diagnoses:  Abscess    Rx / DC  Orders ED Discharge Orders          Ordered    doxycycline (VIBRAMYCIN) 100 MG capsule  2 times daily        05/15/22 1331              Azucena Cecil, [place] 05/15/22 1332    Blanchie Dessert, MD 05/15/22 1523

## 2022-05-15 NOTE — ED Provider Triage Note (Signed)
  Emergency Medicine Provider Triage Evaluation Note  MRN:  [id number]  Arrival date & time: 05/15/22    Medically screening exam initiated at 12:56 AM.   CC:   Abscess   HPI:  Nicole Woodard is a 27 y.o. year-old female presents to the ED with chief complaint of abscess to labia. States she recently had a wax and then it became irritated, swollen, and painful.  Febrile in triage.  Denies pregnancy or breastfeeding.  History provided by patient. ROS:  -As included in HPI PE:   Vitals:   05/15/22 0052  BP: (!) 138/90  Pulse: (!) 112  Resp: 17  Temp: 100.3 F (37.9 C)  SpO2: 100%    Non-toxic appearing No respiratory distress Developing abscess vs folliculitis to left labia, nurse tech chaperone present MDM:  Based on signs and symptoms, abscess is highest on my differential, followed by cellulitis. I've ordered labs in triage to expedite lab/diagnostic workup.  Patient was informed that the remainder of the evaluation will be completed by another provider, this initial triage assessment does not replace that evaluation, and the importance of remaining in the ED until their evaluation is complete.    Montine Circle, PA-C 05/15/22 0100

## 2022-05-15 NOTE — ED Provider Triage Note (Signed)
Emergency Medicine Provider Triage Evaluation Note  Nicole Woodard , a 27 y.o. female  was evaluated in triage.  Pt complains of abscess to left pubic area after wax a few days ago, applying hot rag without improvement, no fevers, no drainage, denies possibility of pregnancy.  Review of Systems  Positive:  Negative:   Physical Exam  BP 125/86 (BP Location: Right Arm)   Pulse 100   Temp 98.4 F (36.9 C) (Oral)   Resp 16   SpO2 98%  Gen:   Awake, no distress   Resp:  Normal effort  MSK:   Moves extremities without difficulty  Other:  Chaperone present- redness with fluctuance to left pubic area without drainage   Medical Decision Making  Medically screening exam initiated at 11:02 AM.  Appropriate orders placed.  Nicole Woodard was informed that the remainder of the evaluation will be completed by another provider, this initial triage assessment does not replace that evaluation, and the importance of remaining in the ED until their evaluation is complete.     Tacy Learn, PA-C 05/15/22 1103

## 2022-05-20 ENCOUNTER — Encounter: Payer: Self-pay | Admitting: Obstetrics

## 2022-05-20 ENCOUNTER — Other Ambulatory Visit (HOSPITAL_COMMUNITY)
Admission: RE | Admit: 2022-05-20 | Discharge: 2022-05-20 | Disposition: A | Discharge status: Home / Self Care | Payer: Medicaid Other | Source: Ambulatory Visit | Attending: Obstetrics | Admitting: Obstetrics

## 2022-05-20 ENCOUNTER — Ambulatory Visit (INDEPENDENT_AMBULATORY_CARE_PROVIDER_SITE_OTHER): Payer: Medicaid Other | Admitting: Obstetrics

## 2022-05-20 VITALS — BP 125/74 | HR 83 | Ht 66.93 in | Wt 229.7 lb

## 2022-05-20 DIAGNOSIS — N898 Other specified noninflammatory disorders of vagina: Secondary | ICD-10-CM | POA: Diagnosis present

## 2022-05-20 DIAGNOSIS — E669 Obesity, unspecified: Secondary | ICD-10-CM

## 2022-05-20 DIAGNOSIS — Z113 Encounter for screening for infections with a predominantly sexual mode of transmission: Secondary | ICD-10-CM

## 2022-05-20 DIAGNOSIS — Z01419 Encounter for gynecological examination (general) (routine) without abnormal findings: Secondary | ICD-10-CM | POA: Diagnosis present

## 2022-05-20 DIAGNOSIS — Z3041 Encounter for surveillance of contraceptive pills: Secondary | ICD-10-CM

## 2022-05-20 DIAGNOSIS — Z3009 Encounter for other general counseling and advice on contraception: Secondary | ICD-10-CM

## 2022-05-20 MED ORDER — AUROVELA 24 FE 1-20 MG-MCG(24) PO TABS: 1.0000 | ORAL_TABLET | Freq: Every day | ORAL | 11 refills | Status: DC

## 2022-05-20 NOTE — Progress Notes (Signed)
Subjective:        Nicole Woodard is a 27 y.o. female here for a routine exam.  Current complaints: Vaginal discharge.    Personal health questionnaire:  Is patient [name], have a family history of breast and/or ovarian cancer: no Is there a family history of uterine cancer diagnosed at age < 11, gastrointestinal cancer, urinary tract cancer, family member who is a Field seismologist syndrome-associated carrier: no Is the patient overweight and hypertensive, family history of diabetes, personal history of gestational diabetes, preeclampsia or PCOS: no Is patient over 56, have PCOS,  family history of premature CHD under age 53, diabetes, smoke, have hypertension or peripheral artery disease:  no At any time, has a partner hit, kicked or otherwise hurt or frightened you?: no Over the past 2 weeks, have you felt down, depressed or hopeless?: no Over the past 2 weeks, have you felt little interest or pleasure in doing things?:no   Gynecologic History Patient's last menstrual period was 05/12/2022. Contraception: OCP (estrogen/progesterone) Last Pap: 2023. Results were: abnormal: :( LGSIL ) Last mammogram: n/a. Results were: n/a  Obstetric History OB History  Gravida Para Term Preterm AB Living  1 1 1     1   SAB IAB Ectopic Multiple Live Births        0 1    # Outcome Date GA Lbr Len/2nd Weight Sex Delivery Anes PTL Lv  1 Term 03/10/15 [redacted]w[redacted]d 12:38 / 00:43 7 lb 7.1 oz (3.375 kg) M Vag-Spont EPI  LIV    Past Medical History:  Diagnosis Date   Medical history non-contributory     Past Surgical History:  Procedure Laterality Date   NO PAST SURGERIES       Current Outpatient Medications:    doxycycline (VIBRAMYCIN) 100 MG capsule, Take 1 capsule (100 mg total) by mouth 2 (two) times daily., Disp: 14 capsule, Rfl: 0   metroNIDAZOLE (FLAGYL) 500 MG tablet, Take 1 tablet (500 mg total) by mouth 2 (two) times daily. (Patient not taking: Reported on 05/20/2022), Disp: 14 tablet,  Rfl: 0   metroNIDAZOLE (METROGEL VAGINAL) 0.75 % vaginal gel, Place 1 Applicatorful vaginally at bedtime. (Patient not taking: Reported on 05/20/2022), Disp: 70 g, Rfl: 0   Norethindrone Acetate-Ethinyl Estrad-FE (AUROVELA 24 FE) 1-20 MG-MCG(24) tablet, Take 1 tablet by mouth daily., Disp: 28 tablet, Rfl: 11 No Known Allergies  Social History   Tobacco Use   Smoking status: Former    Types: Cigarettes    Quit date: 2016    Years since quitting: 8.0    Passive exposure: Past   Smokeless tobacco: Never  Substance Use Topics   Alcohol use: Yes    Comment: occ    Family History  Problem Relation Age of Onset   Hypertension Other    Cancer Other       Review of Systems  Constitutional: negative for fatigue and weight loss Respiratory: negative for cough and wheezing Cardiovascular: negative for chest pain, fatigue and palpitations Gastrointestinal: negative for abdominal pain and change in bowel habits Musculoskeletal:negative for myalgias Neurological: negative for gait problems and tremors Behavioral/Psych: negative for abusive relationship, depression Endocrine: negative for temperature intolerance    Genitourinary:negative for abnormal menstrual periods, genital lesions, hot flashes, sexual problems and vaginal discharge Integument/breast: negative for breast lump, breast tenderness, nipple discharge and skin lesion(s)    Objective:       BP 125/74   Pulse 83   Ht 5' 6.93" (1.7 m)   Wt 229  lb 11.2 oz (104.2 kg)   LMP 05/12/2022   BMI 36.05 kg/m  General:   Alert and no distress  Skin:   no rash or abnormalities  Lungs:   clear to auscultation bilaterally  Heart:   regular rate and rhythm, S1, S2 normal, no murmur, click, rub or gallop  Breasts:   normal without suspicious masses, skin or nipple changes or axillary nodes  Abdomen:  normal findings: no organomegaly, soft, non-tender and no hernia  Pelvis:  External genitalia: normal general appearance Urinary  system: urethral meatus normal and bladder without fullness, nontender Vaginal: normal without tenderness, induration or masses Cervix: normal appearance Adnexa: normal bimanual exam Uterus: anteverted and non-tender, normal size   Lab Review Urine pregnancy test Labs reviewed yes Radiologic studies reviewed no  I have spent a total of 20 minutes of face-to-face time, excluding clinical staff time, reviewing notes and preparing to see patient, ordering tests and/or medications, and counseling the patient.   Assessment:    1. Encounter for gynecological examination with Papanicolaou smear of cervix Rx: - Cytology - PAP( [facility])  2. Vaginal discharge Rx: - Cervicovaginal ancillary only( [facility])  3. Screening examination for STD (sexually transmitted disease) Rx: - HIV antibody (with reflex) - RPR - Hepatitis C Antibody - Hepatitis B Surface AntiGEN  4. Encounter for counseling regarding contraception - doing well on OCP's  5. Obesity (BMI 30-39.9) - weight reduction recommended   6. Encounter for surveillance of contraceptive pills Rx: - Norethindrone Acetate-Ethinyl Estrad-FE (AUROVELA 24 FE) 1-20 MG-MCG(24) tablet; Take 1 tablet by mouth daily.  Dispense: 28 tablet; Refill: 11     Plan:    Education reviewed: calcium supplements, depression evaluation, low fat, low cholesterol diet, safe sex/STD prevention, self breast exams, and weight bearing exercise. Contraception: OCP (estrogen/progesterone). Follow up in: 1 year.   Meds ordered this encounter  Medications   Norethindrone Acetate-Ethinyl Estrad-FE (AUROVELA 24 FE) 1-20 MG-MCG(24) tablet    Sig: Take 1 tablet by mouth daily.    Dispense:  28 tablet    Refill:  11   Orders Placed This Encounter  Procedures   HIV antibody (with reflex)   RPR   Hepatitis C Antibody   Hepatitis B Surface AntiGEN     Khloe Hunkele A. Jodi Mourning MD 05/20/2022

## 2022-05-20 NOTE — Progress Notes (Signed)
Pt presents for AEX and STD screening.

## 2022-05-21 LAB — CERVICOVAGINAL ANCILLARY ONLY
Bacterial Vaginitis (gardnerella): POSITIVE — AB
Candida Glabrata: NEGATIVE
Candida Vaginitis: NEGATIVE
Chlamydia: NEGATIVE
Comment: NEGATIVE
Comment: NEGATIVE
Comment: NEGATIVE
Comment: NEGATIVE
Comment: NEGATIVE
Comment: NORMAL
Neisseria Gonorrhea: NEGATIVE
Trichomonas: NEGATIVE

## 2022-05-21 LAB — HIV ANTIBODY (ROUTINE TESTING W REFLEX): HIV Screen 4th Generation wRfx: NONREACTIVE

## 2022-05-21 LAB — RPR: RPR Ser Ql: NONREACTIVE

## 2022-05-21 LAB — HEPATITIS C ANTIBODY: Hep C Virus Ab: REACTIVE — AB

## 2022-05-21 LAB — HEPATITIS B SURFACE ANTIGEN: Hepatitis B Surface Ag: NEGATIVE

## 2022-06-03 LAB — CYTOLOGY - PAP: Diagnosis: NEGATIVE

## 2022-06-10 ENCOUNTER — Other Ambulatory Visit: Payer: Self-pay

## 2022-06-10 ENCOUNTER — Encounter: Payer: Self-pay | Admitting: Obstetrics

## 2022-06-10 DIAGNOSIS — B379 Candidiasis, unspecified: Secondary | ICD-10-CM

## 2022-06-10 MED ORDER — FLUCONAZOLE 150 MG PO TABS: 150.0000 mg | ORAL_TABLET | Freq: Once | ORAL | 0 refills | Status: AC

## 2022-06-29 ENCOUNTER — Other Ambulatory Visit: Payer: Self-pay

## 2022-06-29 DIAGNOSIS — B9689 Other specified bacterial agents as the cause of diseases classified elsewhere: Secondary | ICD-10-CM

## 2022-06-29 DIAGNOSIS — A599 Trichomoniasis, unspecified: Secondary | ICD-10-CM

## 2022-06-29 MED ORDER — METRONIDAZOLE 500 MG PO TABS: 500.0000 mg | ORAL_TABLET | Freq: Two times a day (BID) | ORAL | 0 refills | Status: DC

## 2022-06-29 NOTE — Progress Notes (Signed)
Patient is requesting a prescription for BV.  Flagyl sent per protocol.

## 2022-08-19 ENCOUNTER — Ambulatory Visit: Payer: Medicaid Other

## 2022-08-27 ENCOUNTER — Ambulatory Visit: Payer: Medicaid Other

## 2022-09-09 ENCOUNTER — Other Ambulatory Visit (HOSPITAL_COMMUNITY)
Admission: RE | Admit: 2022-09-09 | Discharge: 2022-09-09 | Disposition: A | Discharge status: Home / Self Care | Payer: Medicaid Other | Source: Ambulatory Visit | Attending: Obstetrics | Admitting: Obstetrics

## 2022-09-09 ENCOUNTER — Ambulatory Visit (INDEPENDENT_AMBULATORY_CARE_PROVIDER_SITE_OTHER): Payer: Self-pay | Admitting: Obstetrics

## 2022-09-09 ENCOUNTER — Encounter: Payer: Self-pay | Admitting: Obstetrics

## 2022-09-09 VITALS — BP 124/80 | HR 86 | Wt 237.7 lb

## 2022-09-09 DIAGNOSIS — N898 Other specified noninflammatory disorders of vagina: Secondary | ICD-10-CM | POA: Insufficient documentation

## 2022-09-09 DIAGNOSIS — Z8619 Personal history of other infectious and parasitic diseases: Secondary | ICD-10-CM

## 2022-09-09 DIAGNOSIS — Z113 Encounter for screening for infections with a predominantly sexual mode of transmission: Secondary | ICD-10-CM

## 2022-09-09 NOTE — Progress Notes (Signed)
Pt is in the office for GYN visit Last pap 05/20/2022 LMP 08/16/22 Pt reports that she never followed up with Infectious Disease related to positive Hep C, and pt is requesting repeat testing today. Reports vaginal discharge today.

## 2022-09-09 NOTE — Progress Notes (Signed)
Patient ID: Nicole Woodard, female   DOB: 1995/10/09, 27 y.o.   MRN: [id number]  Chief Complaint  Patient presents with   GYN    HPI Nicole Woodard is a 27 y.o. female.  Complains of vaginal discharge.  History of positive Hep C screen, and has not been evaluated by Infectious Disease yet. HPI  Past Medical History:  Diagnosis Date   Medical history non-contributory     Past Surgical History:  Procedure Laterality Date   NO PAST SURGERIES      Family History  Problem Relation Age of Onset   Hypertension Other    Cancer Other     Social History Social History   Tobacco Use   Smoking status: Former    Types: Cigarettes    Quit date: 2016    Years since quitting: 8.3    Passive exposure: Past   Smokeless tobacco: Never  Vaping Use   Vaping Use: Never used  Substance Use Topics   Alcohol use: Yes    Comment: occ   Drug use: No    No Known Allergies  Current Outpatient Medications  Medication Sig Dispense Refill   Norethindrone Acetate-Ethinyl Estrad-FE (AUROVELA 24 FE) 1-20 MG-MCG(24) tablet Take 1 tablet by mouth daily. 28 tablet 11   doxycycline (VIBRAMYCIN) 100 MG capsule Take 1 capsule (100 mg total) by mouth 2 (two) times daily. (Patient not taking: Reported on 09/09/2022) 14 capsule 0   metroNIDAZOLE (FLAGYL) 500 MG tablet Take 1 tablet (500 mg total) by mouth 2 (two) times daily. (Patient not taking: Reported on 09/09/2022) 14 tablet 0   No current facility-administered medications for this visit.    Review of Systems Review of Systems Constitutional: negative for fatigue and weight loss Respiratory: negative for cough and wheezing Cardiovascular: negative for chest pain, fatigue and palpitations Gastrointestinal: negative for abdominal pain and change in bowel habits Genitourinary: positive for vaginal discharge Integument/breast: negative for nipple discharge Musculoskeletal:negative for myalgias Neurological: negative for gait problems and  tremors Behavioral/Psych: negative for abusive relationship, depression Endocrine: negative for temperature intolerance      Blood pressure 124/80, pulse 86, weight 237 lb 11.2 oz (107.8 kg), last menstrual period 08/16/2022.  Physical Exam Physical Exam General:   Alert and no distress  Skin:   no rash or abnormalities  Lungs:   clear to auscultation bilaterally  Heart:   regular rate and rhythm, S1, S2 normal, no murmur, click, rub or gallop  Breasts:   Not examined  Abdomen:  normal findings: no organomegaly, soft, non-tender and no hernia  Pelvis:  External genitalia: normal general appearance Urinary system: urethral meatus normal and bladder without fullness, nontender Vaginal: normal without tenderness, induration or masses Cervix: normal appearance Adnexa: normal bimanual exam Uterus: anteverted and non-tender, normal size    I have spent a total of 20 minutes of face-to-face time, excluding clinical staff time, reviewing notes and preparing to see patient, ordering tests and/or medications, and counseling the patient.   Data Reviewed Labs  Assessment     1. Vaginal discharge Rx: - Cervicovaginal ancillary only( [place])  2. Screening examination for STD (sexually transmitted disease) Rx: - HIV antibody (with reflex) - RPR - Hepatitis B Surface AntiGEN - Hepatitis C Antibody     Plan   Follow up prn  Orders Placed This Encounter  Procedures   HIV antibody (with reflex)   RPR   Hepatitis B Surface AntiGEN   Hepatitis C Antibody     Clearance Coots,  Bing Neighbors, MD 09/09/2022 10:05 AM

## 2022-09-10 LAB — CERVICOVAGINAL ANCILLARY ONLY
Bacterial Vaginitis (gardnerella): POSITIVE — AB
Candida Glabrata: NEGATIVE
Candida Vaginitis: POSITIVE — AB
Chlamydia: NEGATIVE
Comment: NEGATIVE
Comment: NEGATIVE
Comment: NEGATIVE
Comment: NEGATIVE
Comment: NEGATIVE
Comment: NORMAL
Neisseria Gonorrhea: NEGATIVE
Trichomonas: NEGATIVE

## 2022-09-10 LAB — HEPATITIS C ANTIBODY: Hep C Virus Ab: NONREACTIVE

## 2022-09-10 LAB — HEPATITIS B SURFACE ANTIGEN: Hepatitis B Surface Ag: NEGATIVE

## 2022-09-10 LAB — HIV ANTIBODY (ROUTINE TESTING W REFLEX): HIV Screen 4th Generation wRfx: NONREACTIVE

## 2022-09-10 LAB — RPR: RPR Ser Ql: NONREACTIVE

## 2022-09-11 ENCOUNTER — Other Ambulatory Visit: Payer: Self-pay | Admitting: Obstetrics

## 2022-09-11 DIAGNOSIS — A599 Trichomoniasis, unspecified: Secondary | ICD-10-CM

## 2022-09-11 DIAGNOSIS — B379 Candidiasis, unspecified: Secondary | ICD-10-CM

## 2022-09-11 DIAGNOSIS — B9689 Other specified bacterial agents as the cause of diseases classified elsewhere: Secondary | ICD-10-CM

## 2022-09-11 MED ORDER — FLUCONAZOLE 150 MG PO TABS: 150.0000 mg | ORAL_TABLET | Freq: Once | ORAL | 0 refills | Status: AC

## 2022-09-11 MED ORDER — METRONIDAZOLE 500 MG PO TABS: 500.0000 mg | ORAL_TABLET | Freq: Two times a day (BID) | ORAL | 0 refills | Status: DC

## 2022-09-13 ENCOUNTER — Other Ambulatory Visit (HOSPITAL_COMMUNITY): Payer: Self-pay

## 2022-09-13 ENCOUNTER — Telehealth: Payer: Self-pay

## 2022-09-13 NOTE — Telephone Encounter (Signed)
[facility] Patient Advocate Encounter ? ?Insurance verification completed.   ? ?The patient is uninsured and will need patient assistance for medication. ? ?We can complete the application and will need to meet with the patient for signatures and income documentation. ? ?Loralye Loberg, CPhT ?Specialty Pharmacy Patient Advocate ?[facility] ?Phone: [phone] ?Fax:  [phone]  ?

## 2022-09-16 ENCOUNTER — Encounter: Payer: Medicaid Other | Admitting: Family

## 2022-09-23 ENCOUNTER — Encounter: Payer: Self-pay | Admitting: Obstetrics

## 2022-11-01 ENCOUNTER — Ambulatory Visit: Payer: Medicaid Other

## 2022-11-05 ENCOUNTER — Ambulatory Visit: Payer: Medicaid Other

## 2022-11-08 ENCOUNTER — Other Ambulatory Visit (HOSPITAL_COMMUNITY)
Admission: RE | Admit: 2022-11-08 | Discharge: 2022-11-08 | Disposition: A | Discharge status: Home / Self Care | Payer: Medicaid Other | Source: Ambulatory Visit | Attending: Obstetrics and Gynecology | Admitting: Obstetrics and Gynecology

## 2022-11-08 ENCOUNTER — Ambulatory Visit (INDEPENDENT_AMBULATORY_CARE_PROVIDER_SITE_OTHER): Payer: Self-pay | Admitting: *Deleted

## 2022-11-08 DIAGNOSIS — N898 Other specified noninflammatory disorders of vagina: Secondary | ICD-10-CM | POA: Diagnosis present

## 2022-11-08 NOTE — Progress Notes (Signed)
SUBJECTIVE:  27 y.o. female who desires a STI screen. Denies  bleeding or significant pelvic pain. No UTI symptoms. Denies history of known exposure to STD.  Pt stating vaginal itching, and white discharge x 1 week   No LMP recorded.  OBJECTIVE:  She appears well.   ASSESSMENT:  STI Screen   PLAN:  Pt offered STI blood screening-not indicated GC, chlamydia, and trichomonas probe sent to lab.  Treatment: To be determined once lab results are received.  Pt follow up as needed.

## 2022-11-09 LAB — CERVICOVAGINAL ANCILLARY ONLY
Bacterial Vaginitis (gardnerella): POSITIVE — AB
Candida Glabrata: NEGATIVE
Candida Vaginitis: NEGATIVE
Chlamydia: NEGATIVE
Comment: NEGATIVE
Comment: NEGATIVE
Comment: NEGATIVE
Comment: NEGATIVE
Comment: NEGATIVE
Comment: NORMAL
Neisseria Gonorrhea: NEGATIVE
Trichomonas: NEGATIVE

## 2022-11-09 NOTE — Progress Notes (Signed)
Patient was assessed and managed by nursing staff during this encounter. I have reviewed the chart and agree with the documentation and plan. I have also made any necessary editorial changes.  Warden Fillers, MD 11/09/2022 1:10 PM

## 2022-11-10 ENCOUNTER — Other Ambulatory Visit: Payer: Self-pay | Admitting: Obstetrics

## 2022-11-10 DIAGNOSIS — B9689 Other specified bacterial agents as the cause of diseases classified elsewhere: Secondary | ICD-10-CM

## 2022-11-11 MED ORDER — METRONIDAZOLE 500 MG PO TABS
500.0000 mg | ORAL_TABLET | Freq: Two times a day (BID) | ORAL | 0 refills | Status: DC
Start: 2022-11-11 — End: 2023-02-23

## 2022-12-03 ENCOUNTER — Ambulatory Visit: Payer: Medicaid Other

## 2022-12-13 ENCOUNTER — Other Ambulatory Visit (HOSPITAL_COMMUNITY)
Admission: RE | Admit: 2022-12-13 | Discharge: 2022-12-13 | Disposition: A | Discharge status: Home / Self Care | Payer: Medicaid Other | Source: Ambulatory Visit | Attending: Obstetrics & Gynecology | Admitting: Obstetrics & Gynecology

## 2022-12-13 ENCOUNTER — Ambulatory Visit: Payer: Medicaid Other | Admitting: Emergency Medicine

## 2022-12-13 VITALS — BP 110/76 | HR 109 | Wt 228.0 lb

## 2022-12-13 DIAGNOSIS — B9689 Other specified bacterial agents as the cause of diseases classified elsewhere: Secondary | ICD-10-CM | POA: Insufficient documentation

## 2022-12-13 DIAGNOSIS — N898 Other specified noninflammatory disorders of vagina: Secondary | ICD-10-CM | POA: Insufficient documentation

## 2022-12-13 DIAGNOSIS — N760 Acute vaginitis: Secondary | ICD-10-CM | POA: Insufficient documentation

## 2022-12-13 NOTE — Progress Notes (Signed)
SUBJECTIVE:  27 y.o. female complains of clear vaginal discharge for 1 month(s). Denies abnormal vaginal bleeding or significant pelvic pain or fever. No UTI symptoms. Denies history of known exposure to STD.  OBJECTIVE:  She appears well, afebrile. Urine dipstick: not done.  ASSESSMENT:  Vaginal Discharge  Vaginal Odor   PLAN:  GC, chlamydia, trichomonas, BVAG, CVAG probe sent to lab. Treatment: To be determined once lab results are received ROV prn if symptoms persist or worsen.

## 2023-01-11 ENCOUNTER — Ambulatory Visit: Payer: Medicaid Other

## 2023-01-19 ENCOUNTER — Ambulatory Visit (INDEPENDENT_AMBULATORY_CARE_PROVIDER_SITE_OTHER): Payer: Self-pay

## 2023-01-19 ENCOUNTER — Other Ambulatory Visit (HOSPITAL_COMMUNITY)
Admission: RE | Admit: 2023-01-19 | Discharge: 2023-01-19 | Disposition: A | Discharge status: Home / Self Care | Payer: Medicaid Other | Source: Ambulatory Visit | Attending: Obstetrics and Gynecology | Admitting: Obstetrics and Gynecology

## 2023-01-19 DIAGNOSIS — N898 Other specified noninflammatory disorders of vagina: Secondary | ICD-10-CM | POA: Diagnosis present

## 2023-01-19 DIAGNOSIS — A5602 Chlamydial vulvovaginitis: Secondary | ICD-10-CM | POA: Diagnosis not present

## 2023-01-19 DIAGNOSIS — Z113 Encounter for screening for infections with a predominantly sexual mode of transmission: Secondary | ICD-10-CM | POA: Insufficient documentation

## 2023-01-19 DIAGNOSIS — B3731 Acute candidiasis of vulva and vagina: Secondary | ICD-10-CM | POA: Insufficient documentation

## 2023-01-19 NOTE — Progress Notes (Signed)
..  SUBJECTIVE:  27 y.o. female complains of vaginal discharge. Denies abnormal vaginal bleeding or significant pelvic pain or fever. No UTI symptoms. Denies history of known exposure to STD.  Patient's last menstrual period was 01/16/2023.  OBJECTIVE:  She appears well, afebrile. Urine dipstick: not done.  ASSESSMENT:  Vaginal Discharge  Vaginal Odor   PLAN:  GC, chlamydia, trichomonas, BVAG, CVAG probe sent to lab. Treatment: To be determined once lab results are received ROV prn if symptoms persist or worsen.

## 2023-01-21 LAB — CERVICOVAGINAL ANCILLARY ONLY
Bacterial Vaginitis (gardnerella): NEGATIVE
Candida Glabrata: NEGATIVE
Candida Vaginitis: POSITIVE — AB
Chlamydia: POSITIVE — AB
Comment: NEGATIVE
Comment: NEGATIVE
Comment: NEGATIVE
Comment: NEGATIVE
Comment: NEGATIVE
Comment: NORMAL
Neisseria Gonorrhea: NEGATIVE
Trichomonas: NEGATIVE

## 2023-01-24 ENCOUNTER — Other Ambulatory Visit: Payer: Self-pay

## 2023-01-24 ENCOUNTER — Encounter: Payer: Self-pay | Admitting: Obstetrics

## 2023-01-24 DIAGNOSIS — B379 Candidiasis, unspecified: Secondary | ICD-10-CM

## 2023-01-24 DIAGNOSIS — A749 Chlamydial infection, unspecified: Secondary | ICD-10-CM

## 2023-01-24 MED ORDER — DOXYCYCLINE HYCLATE 100 MG PO CAPS
100.0000 mg | ORAL_CAPSULE | Freq: Two times a day (BID) | ORAL | 0 refills | Status: DC
Start: 2023-01-24 — End: 2023-06-08

## 2023-01-24 MED ORDER — FLUCONAZOLE 150 MG PO TABS: 150.0000 mg | ORAL_TABLET | Freq: Once | ORAL | 0 refills | Status: AC

## 2023-01-26 ENCOUNTER — Other Ambulatory Visit: Payer: Self-pay

## 2023-01-26 DIAGNOSIS — A749 Chlamydial infection, unspecified: Secondary | ICD-10-CM

## 2023-01-26 MED ORDER — AZITHROMYCIN 500 MG PO TABS
1000.0000 mg | ORAL_TABLET | Freq: Once | ORAL | 0 refills | Status: AC
Start: 2023-01-26 — End: 2023-01-26

## 2023-02-22 ENCOUNTER — Encounter: Payer: Self-pay | Admitting: Obstetrics and Gynecology

## 2023-02-22 ENCOUNTER — Ambulatory Visit: Payer: Medicaid Other | Admitting: Obstetrics and Gynecology

## 2023-02-22 ENCOUNTER — Other Ambulatory Visit (HOSPITAL_COMMUNITY)
Admission: RE | Admit: 2023-02-22 | Discharge: 2023-02-22 | Disposition: A | Discharge status: Home / Self Care | Payer: Medicaid Other | Source: Ambulatory Visit | Attending: Obstetrics and Gynecology | Admitting: Obstetrics and Gynecology

## 2023-02-22 VITALS — BP 119/75 | HR 96 | Ht 66.0 in | Wt 227.0 lb

## 2023-02-22 DIAGNOSIS — Z113 Encounter for screening for infections with a predominantly sexual mode of transmission: Secondary | ICD-10-CM | POA: Insufficient documentation

## 2023-02-22 DIAGNOSIS — N760 Acute vaginitis: Secondary | ICD-10-CM | POA: Diagnosis not present

## 2023-02-22 DIAGNOSIS — B9689 Other specified bacterial agents as the cause of diseases classified elsewhere: Secondary | ICD-10-CM | POA: Diagnosis not present

## 2023-02-22 NOTE — Progress Notes (Signed)
27 y.o. GYN presents for STD Screening, she completed treatment 01/27/23.

## 2023-02-22 NOTE — Progress Notes (Signed)
Patient not seen by provider. She opted to do self swab and blood work for STI screening which she does monthly per her preference

## 2023-02-23 LAB — RPR: RPR Ser Ql: NONREACTIVE

## 2023-02-23 LAB — CERVICOVAGINAL ANCILLARY ONLY
Bacterial Vaginitis (gardnerella): POSITIVE — AB
Candida Glabrata: NEGATIVE
Candida Vaginitis: NEGATIVE
Chlamydia: NEGATIVE
Comment: NEGATIVE
Comment: NEGATIVE
Comment: NEGATIVE
Comment: NEGATIVE
Comment: NEGATIVE
Comment: NORMAL
Neisseria Gonorrhea: NEGATIVE
Trichomonas: NEGATIVE

## 2023-02-23 LAB — HEPATITIS C ANTIBODY: Hep C Virus Ab: NONREACTIVE

## 2023-02-23 LAB — HIV ANTIBODY (ROUTINE TESTING W REFLEX): HIV Screen 4th Generation wRfx: NONREACTIVE

## 2023-02-23 LAB — HEPATITIS B SURFACE ANTIGEN: Hepatitis B Surface Ag: NEGATIVE

## 2023-02-23 MED ORDER — METRONIDAZOLE 500 MG PO TABS: 500.0000 mg | ORAL_TABLET | Freq: Two times a day (BID) | ORAL | 0 refills | Status: DC

## 2023-02-23 NOTE — Addendum Note (Signed)
Addended by: Catalina Antigua on: 02/23/2023 03:07 PM   Modules accepted: Orders

## 2023-03-07 ENCOUNTER — Encounter: Payer: Self-pay | Admitting: Obstetrics

## 2023-03-07 ENCOUNTER — Other Ambulatory Visit: Payer: Self-pay | Admitting: *Deleted

## 2023-03-07 DIAGNOSIS — B9689 Other specified bacterial agents as the cause of diseases classified elsewhere: Secondary | ICD-10-CM

## 2023-03-07 MED ORDER — METRONIDAZOLE 0.75 % VA GEL: 1.0000 | Freq: Every day | VAGINAL | 1 refills | Status: DC

## 2023-03-07 NOTE — Progress Notes (Signed)
Pt request metrogel instead of po metronidazole. RX per protocol.

## 2023-03-24 ENCOUNTER — Ambulatory Visit: Payer: Medicaid Other | Admitting: Obstetrics

## 2023-03-28 ENCOUNTER — Ambulatory Visit: Payer: Medicaid Other

## 2023-04-13 ENCOUNTER — Ambulatory Visit: Payer: Medicaid Other

## 2023-04-14 ENCOUNTER — Ambulatory Visit (INDEPENDENT_AMBULATORY_CARE_PROVIDER_SITE_OTHER): Payer: Self-pay | Admitting: *Deleted

## 2023-04-14 ENCOUNTER — Other Ambulatory Visit (HOSPITAL_COMMUNITY)
Admission: RE | Admit: 2023-04-14 | Discharge: 2023-04-14 | Disposition: A | Discharge status: Home / Self Care | Payer: Medicaid Other | Source: Ambulatory Visit | Attending: Obstetrics and Gynecology | Admitting: Obstetrics and Gynecology

## 2023-04-14 DIAGNOSIS — Z113 Encounter for screening for infections with a predominantly sexual mode of transmission: Secondary | ICD-10-CM | POA: Diagnosis present

## 2023-04-14 DIAGNOSIS — N898 Other specified noninflammatory disorders of vagina: Secondary | ICD-10-CM | POA: Diagnosis present

## 2023-04-14 NOTE — Progress Notes (Signed)
SUBJECTIVE:  27 y.o. female complains of thin vaginal discharge for 1 week(s). Denies abnormal vaginal bleeding or significant pelvic pain or fever. No UTI symptoms. Denies history of known exposure to STD. Pt request full panel swab and std lab work.   No LMP recorded.  OBJECTIVE:  She appears well, afebrile. Urine dipstick: not done.  ASSESSMENT:  Vaginal Discharge     PLAN:  GC, chlamydia, trichomonas, BVAG, CVAG probe sent to lab. HIV,RPR,HepB,HepC ordered Treatment: To be determined once lab results are received ROV prn if symptoms persist or worsen.

## 2023-04-15 LAB — HEPATITIS B SURFACE ANTIGEN: Hepatitis B Surface Ag: NEGATIVE

## 2023-04-15 LAB — CERVICOVAGINAL ANCILLARY ONLY
Bacterial Vaginitis (gardnerella): NEGATIVE
Candida Glabrata: NEGATIVE
Candida Vaginitis: NEGATIVE
Chlamydia: NEGATIVE
Comment: NEGATIVE
Comment: NEGATIVE
Comment: NEGATIVE
Comment: NEGATIVE
Comment: NEGATIVE
Comment: NORMAL
Neisseria Gonorrhea: NEGATIVE
Trichomonas: NEGATIVE

## 2023-04-15 LAB — HIV ANTIBODY (ROUTINE TESTING W REFLEX): HIV Screen 4th Generation wRfx: NONREACTIVE

## 2023-04-15 LAB — RPR: RPR Ser Ql: NONREACTIVE

## 2023-04-15 LAB — HEPATITIS C ANTIBODY: Hep C Virus Ab: NONREACTIVE

## 2023-05-13 ENCOUNTER — Ambulatory Visit: Payer: Medicaid Other | Admitting: Obstetrics

## 2023-05-18 ENCOUNTER — Other Ambulatory Visit (HOSPITAL_COMMUNITY)
Admission: RE | Admit: 2023-05-18 | Discharge: 2023-05-18 | Disposition: A | Discharge status: Home / Self Care | Payer: Medicaid Other | Source: Ambulatory Visit | Attending: Obstetrics and Gynecology | Admitting: Obstetrics and Gynecology

## 2023-05-18 ENCOUNTER — Ambulatory Visit (INDEPENDENT_AMBULATORY_CARE_PROVIDER_SITE_OTHER): Payer: Self-pay

## 2023-05-18 ENCOUNTER — Encounter: Payer: Self-pay | Admitting: Obstetrics and Gynecology

## 2023-05-18 VITALS — BP 131/71 | HR 104 | Ht 66.0 in | Wt 236.7 lb

## 2023-05-18 DIAGNOSIS — N898 Other specified noninflammatory disorders of vagina: Secondary | ICD-10-CM

## 2023-05-18 NOTE — Progress Notes (Signed)
Nurse visit only

## 2023-05-19 LAB — CERVICOVAGINAL ANCILLARY ONLY
Bacterial Vaginitis (gardnerella): POSITIVE — AB
Candida Glabrata: NEGATIVE
Candida Vaginitis: NEGATIVE
Chlamydia: NEGATIVE
Comment: NEGATIVE
Comment: NEGATIVE
Comment: NEGATIVE
Comment: NEGATIVE
Comment: NEGATIVE
Comment: NORMAL
Neisseria Gonorrhea: NEGATIVE
Trichomonas: NEGATIVE

## 2023-05-20 ENCOUNTER — Encounter: Payer: Self-pay | Admitting: Obstetrics

## 2023-05-23 ENCOUNTER — Other Ambulatory Visit: Payer: Self-pay | Admitting: Emergency Medicine

## 2023-05-23 MED ORDER — METRONIDAZOLE 500 MG PO TABS: 500.0000 mg | ORAL_TABLET | Freq: Two times a day (BID) | ORAL | 0 refills | Status: DC

## 2023-05-23 NOTE — Progress Notes (Signed)
Rx for BV 

## 2023-05-30 ENCOUNTER — Ambulatory Visit: Payer: Medicaid Other | Admitting: Advanced Practice Midwife

## 2023-06-01 ENCOUNTER — Ambulatory Visit: Payer: Medicaid Other | Admitting: Obstetrics

## 2023-06-08 ENCOUNTER — Other Ambulatory Visit (HOSPITAL_COMMUNITY)
Admission: RE | Admit: 2023-06-08 | Discharge: 2023-06-08 | Disposition: A | Discharge status: Home / Self Care | Payer: Medicaid Other | Source: Ambulatory Visit | Attending: Obstetrics & Gynecology | Admitting: Obstetrics & Gynecology

## 2023-06-08 ENCOUNTER — Ambulatory Visit (INDEPENDENT_AMBULATORY_CARE_PROVIDER_SITE_OTHER): Payer: Medicaid Other | Admitting: Obstetrics & Gynecology

## 2023-06-08 VITALS — BP 126/81 | HR 102 | Ht 66.0 in | Wt 244.9 lb

## 2023-06-08 DIAGNOSIS — Z01419 Encounter for gynecological examination (general) (routine) without abnormal findings: Secondary | ICD-10-CM | POA: Insufficient documentation

## 2023-06-08 DIAGNOSIS — Z113 Encounter for screening for infections with a predominantly sexual mode of transmission: Secondary | ICD-10-CM | POA: Diagnosis not present

## 2023-06-08 DIAGNOSIS — Z3041 Encounter for surveillance of contraceptive pills: Secondary | ICD-10-CM

## 2023-06-08 MED ORDER — AUROVELA 24 FE 1-20 MG-MCG(24) PO TABS: 1.0000 | ORAL_TABLET | Freq: Every day | ORAL | 12 refills | Status: AC

## 2023-06-08 NOTE — Progress Notes (Signed)
 Pt. Presents for annual. Pt. Has no questions or concerns at this time.

## 2023-06-08 NOTE — Progress Notes (Signed)
Pt thought she was going self swab. Didn't want to see provider. Did self swab and left.

## 2023-06-08 NOTE — Progress Notes (Signed)
 GYNECOLOGY CLINIC ANNUAL PREVENTATIVE CARE ENCOUNTER NOTE  Subjective:   Nicole Woodard is a 28 y.o. G7P1001 female here for a routine annual gynecologic exam.  Current complaints: wants to continue her OCP.   Denies abnormal vaginal bleeding, discharge, pelvic pain, problems with intercourse or other gynecologic concerns.    Gynecologic History Patient's last menstrual period was 05/24/2023 (exact date). Contraception: OCP (estrogen/progesterone) Last Pap: 05/2022. Results were: normal H/O LSIL pap with normal Bx 05/2021  Obstetric History OB History  Gravida Para Term Preterm AB Living  1 1 1   1   SAB IAB Ectopic Multiple Live Births     0 1    # Outcome Date GA Lbr Len/2nd Weight Sex Type Anes PTL Lv  1 Term 03/10/15 [redacted]w[redacted]d 12:38 / 00:43 7 lb 7.1 oz (3.375 kg) M Vag-Spont EPI  LIV    Past Medical History:  Diagnosis Date   Medical history non-contributory     Past Surgical History:  Procedure Laterality Date   NO PAST SURGERIES      No current outpatient medications on file prior to visit.   No current facility-administered medications on file prior to visit.    No Known Allergies  Social History   Socioeconomic History   Marital status: Single    Spouse name: Not on file   Number of children: Not on file   Years of education: Not on file   Highest education level: Not on file  Occupational History   Not on file  Tobacco Use   Smoking status: Former    Current packs/day: 0.00    Types: Cigarettes    Quit date: 2016    Years since quitting: 9.1    Passive exposure: Past   Smokeless tobacco: Never  Vaping Use   Vaping status: Never Used  Substance and Sexual Activity   Alcohol use: Yes    Comment: occ   Drug use: No   Sexual activity: Yes    Partners: Male    Birth control/protection: OCP, Pill  Other Topics Concern   Not on file  Social History Narrative   Not on file   Social Drivers of Health   Financial Resource Strain: Not on file  Food  Insecurity: No Food Insecurity (04/14/2021)   Hunger Vital Sign    Worried About Running Out of Food in the Last Year: Never true    Ran Out of Food in the Last Year: Never true  Transportation Needs: No Transportation Needs (04/14/2021)   PRAPARE - Administrator, Civil Service (Medical): No    Lack of Transportation (Non-Medical): No  Physical Activity: Not on file  Stress: Not on file  Social Connections: Not on file  Intimate Partner Violence: Not on file    Family History  Problem Relation Age of Onset   Hypertension Other    Cancer Other     The following portions of the patient's history were reviewed and updated as appropriate: allergies, current medications, past family history, past medical history, past social history, past surgical history and problem list.  Review of Systems Pertinent items are noted in HPI.   Objective:  BP 126/81   Pulse (!) 102   Ht 5' 6 (1.676 m)   Wt 244 lb 14.4 oz (111.1 kg)   LMP 05/24/2023 (Exact Date)   BMI 39.53 kg/m  CONSTITUTIONAL: Well-developed, well-nourished female in no acute distress.  HENT:  Normocephalic, atraumatic, External right and left ear normal. Oropharynx is clear and  moist EYES: Conjunctivae and EOM are normal. Pupils are equal, round, and reactive to light. No scleral icterus.  NECK: Normal range of motion, supple, no masses.  Normal thyroid.  SKIN: Skin is warm and dry. No rash noted. Not diaphoretic. No erythema. No pallor. NEUROLGIC: Alert and oriented to person, place, and time. Normal reflexes, muscle tone coordination. No cranial nerve deficit noted. PSYCHIATRIC: Normal mood and affect. Normal behavior. Normal judgment and thought content. CARDIOVASCULAR: Normal heart rate noted, regular rhythm RESPIRATORY: Clear to auscultation bilaterally. Effort and breath sounds normal, no problems with respiration noted. BREASTS: Symmetric in size. No masses, skin changes, nipple drainage, or  lymphadenopathy. ABDOMEN: Soft, normal bowel sounds, no distention noted.  No tenderness, rebound or guarding.  PELVIC: not indicated, repeat pap 05/2025 MUSCULOSKELETAL: Normal range of motion. No tenderness.  No cyanosis, clubbing, or edema.   Assessment:  Annual gynecologic examination  Well woman exam with routine gynecological exam - Plan: Cytology - PAP( [place]), HIV antibody (with reflex), RPR, Hepatitis C Antibody, Hepatitis B Surface AntiGEN, CANCELED: Cervicovaginal ancillary only( [place])  Oral contraceptive pill surveillance  Encounter for surveillance of contraceptive pills - Plan: Norethindrone Acetate-Ethinyl Estrad-FE (AUROVELA  24 FE) 1-20 MG-MCG(24) tablet  Plan:  Will follow up results of pap smear and manage accordingly. Routine preventative health maintenance measures emphasized. Please refer to After Visit Summary for other counseling recommendations.    LYNWOOD SOLOMONS, MD Attending Obstetrician & Gynecologist Center for [employer], [facility]

## 2023-06-09 LAB — RPR: RPR Ser Ql: NONREACTIVE

## 2023-06-09 LAB — HEPATITIS B SURFACE ANTIGEN: Hepatitis B Surface Ag: NEGATIVE

## 2023-06-09 LAB — HIV ANTIBODY (ROUTINE TESTING W REFLEX): HIV Screen 4th Generation wRfx: NONREACTIVE

## 2023-06-09 LAB — HEPATITIS C ANTIBODY: Hep C Virus Ab: NONREACTIVE

## 2023-06-10 LAB — CERVICOVAGINAL ANCILLARY ONLY
Bacterial Vaginitis (gardnerella): POSITIVE — AB
Candida Glabrata: NEGATIVE
Candida Vaginitis: NEGATIVE
Chlamydia: NEGATIVE
Comment: NEGATIVE
Comment: NEGATIVE
Comment: NEGATIVE
Comment: NEGATIVE
Comment: NEGATIVE
Comment: NORMAL
Neisseria Gonorrhea: NEGATIVE
Trichomonas: NEGATIVE

## 2023-06-17 ENCOUNTER — Encounter: Payer: Self-pay | Admitting: Obstetrics & Gynecology

## 2023-06-17 MED ORDER — METRONIDAZOLE 500 MG PO TABS: 500.0000 mg | ORAL_TABLET | Freq: Two times a day (BID) | ORAL | 0 refills | Status: DC

## 2023-06-17 NOTE — Addendum Note (Signed)
Addended by: Adam Phenix on: 06/17/2023 10:59 AM   Modules accepted: Orders

## 2023-07-06 ENCOUNTER — Ambulatory Visit

## 2023-07-11 ENCOUNTER — Ambulatory Visit

## 2023-07-12 ENCOUNTER — Other Ambulatory Visit (HOSPITAL_COMMUNITY)
Admission: RE | Admit: 2023-07-12 | Discharge: 2023-07-12 | Disposition: A | Discharge status: Home / Self Care | Source: Ambulatory Visit | Attending: Obstetrics & Gynecology | Admitting: Obstetrics & Gynecology

## 2023-07-12 ENCOUNTER — Ambulatory Visit (INDEPENDENT_AMBULATORY_CARE_PROVIDER_SITE_OTHER): Payer: Self-pay

## 2023-07-12 VITALS — BP 106/72 | HR 78

## 2023-07-12 DIAGNOSIS — N898 Other specified noninflammatory disorders of vagina: Secondary | ICD-10-CM | POA: Diagnosis present

## 2023-07-12 NOTE — Progress Notes (Signed)
 SUBJECTIVE:  28 y.o. female who desires a STI screen. Denies abnormal vaginal discharge, bleeding or significant pelvic pain. No UTI symptoms. Denies history of known exposure to STD.  No LMP recorded.  OBJECTIVE:  She appears well.   ASSESSMENT:  STI Screen   PLAN:  Pt offered STI blood screening-declined GC, chlamydia, and trichomonas probe sent to lab.  Treatment: To be determined once lab results are received.  Pt follow up as needed.

## 2023-07-13 ENCOUNTER — Encounter: Payer: Self-pay | Admitting: Obstetrics and Gynecology

## 2023-07-13 ENCOUNTER — Other Ambulatory Visit: Payer: Self-pay | Admitting: Obstetrics and Gynecology

## 2023-07-13 DIAGNOSIS — Z01419 Encounter for gynecological examination (general) (routine) without abnormal findings: Secondary | ICD-10-CM

## 2023-07-13 LAB — CERVICOVAGINAL ANCILLARY ONLY
Bacterial Vaginitis (gardnerella): POSITIVE — AB
Candida Glabrata: NEGATIVE
Candida Vaginitis: NEGATIVE
Chlamydia: NEGATIVE
Comment: NEGATIVE
Comment: NEGATIVE
Comment: NEGATIVE
Comment: NEGATIVE
Comment: NEGATIVE
Comment: NORMAL
Neisseria Gonorrhea: NEGATIVE
Trichomonas: NEGATIVE

## 2023-07-13 MED ORDER — METRONIDAZOLE 500 MG PO TABS: 500.0000 mg | ORAL_TABLET | Freq: Two times a day (BID) | ORAL | 0 refills | Status: DC

## 2023-08-15 NOTE — Progress Notes (Cosign Needed Addendum)
 Nicole Woodard

## 2023-09-21 ENCOUNTER — Ambulatory Visit

## 2023-10-05 ENCOUNTER — Ambulatory Visit

## 2023-10-06 ENCOUNTER — Ambulatory Visit (INDEPENDENT_AMBULATORY_CARE_PROVIDER_SITE_OTHER)

## 2023-10-06 ENCOUNTER — Other Ambulatory Visit (HOSPITAL_COMMUNITY)
Admission: RE | Admit: 2023-10-06 | Discharge: 2023-10-06 | Disposition: A | Discharge status: Home / Self Care | Source: Ambulatory Visit | Attending: Obstetrics & Gynecology | Admitting: Obstetrics & Gynecology

## 2023-10-06 VITALS — BP 97/60 | HR 79

## 2023-10-06 DIAGNOSIS — Z113 Encounter for screening for infections with a predominantly sexual mode of transmission: Secondary | ICD-10-CM | POA: Diagnosis present

## 2023-10-06 NOTE — Progress Notes (Signed)
 SUBJECTIVE:  28 y.o. female who desires a STI screen. Denies abnormal vaginal discharge, bleeding or significant pelvic pain. No UTI symptoms. Denies history of known exposure to STD.  No LMP recorded.  OBJECTIVE:  She appears well.   ASSESSMENT:  STI Screen   PLAN:  Pt offered STI blood screening-requested GC, chlamydia, and trichomonas probe sent to lab.  Treatment: To be determined once lab results are received.  Pt follow up as needed.

## 2023-10-07 LAB — CERVICOVAGINAL ANCILLARY ONLY
Bacterial Vaginitis (gardnerella): POSITIVE — AB
Candida Glabrata: NEGATIVE
Candida Vaginitis: NEGATIVE
Chlamydia: NEGATIVE
Comment: NEGATIVE
Comment: NEGATIVE
Comment: NEGATIVE
Comment: NEGATIVE
Comment: NEGATIVE
Comment: NORMAL
Neisseria Gonorrhea: NEGATIVE
Trichomonas: NEGATIVE

## 2023-10-08 LAB — HEPATITIS C ANTIBODY: Hep C Virus Ab: NONREACTIVE

## 2023-10-08 LAB — RPR: RPR Ser Ql: NONREACTIVE

## 2023-10-08 LAB — HIV ANTIBODY (ROUTINE TESTING W REFLEX): HIV Screen 4th Generation wRfx: NONREACTIVE

## 2023-10-08 LAB — HEPATITIS B SURFACE ANTIGEN: Hepatitis B Surface Ag: NEGATIVE

## 2023-10-11 ENCOUNTER — Encounter: Payer: Self-pay | Admitting: Obstetrics

## 2023-10-11 ENCOUNTER — Other Ambulatory Visit: Payer: Self-pay

## 2023-10-11 DIAGNOSIS — Z01419 Encounter for gynecological examination (general) (routine) without abnormal findings: Secondary | ICD-10-CM

## 2023-10-11 MED ORDER — METRONIDAZOLE 500 MG PO TABS: 500.0000 mg | ORAL_TABLET | Freq: Two times a day (BID) | ORAL | 0 refills | Status: DC

## 2023-11-02 ENCOUNTER — Other Ambulatory Visit (HOSPITAL_COMMUNITY)
Admission: RE | Admit: 2023-11-02 | Discharge: 2023-11-02 | Disposition: A | Discharge status: Home / Self Care | Source: Ambulatory Visit | Attending: Obstetrics & Gynecology | Admitting: Obstetrics & Gynecology

## 2023-11-02 ENCOUNTER — Ambulatory Visit

## 2023-11-02 VITALS — BP 124/76 | HR 79

## 2023-11-02 DIAGNOSIS — Z113 Encounter for screening for infections with a predominantly sexual mode of transmission: Secondary | ICD-10-CM | POA: Insufficient documentation

## 2023-11-02 NOTE — Progress Notes (Signed)
 SUBJECTIVE:  28 y.o. female who desires a STI screen. Denies abnormal vaginal discharge, bleeding or significant pelvic pain. No UTI symptoms. Denies history of known exposure to STD.  No LMP recorded.  OBJECTIVE:  She appears well.   ASSESSMENT:  STI Screen   PLAN:  Pt offered STI blood screening-not indicated GC, chlamydia, and trichomonas probe sent to lab.  Treatment: To be determined once lab results are received.  Pt follow up as needed.

## 2023-11-03 LAB — CERVICOVAGINAL ANCILLARY ONLY
Bacterial Vaginitis (gardnerella): NEGATIVE
Candida Glabrata: NEGATIVE
Candida Vaginitis: POSITIVE — AB
Chlamydia: NEGATIVE
Comment: NEGATIVE
Comment: NEGATIVE
Comment: NEGATIVE
Comment: NEGATIVE
Comment: NEGATIVE
Comment: NORMAL
Neisseria Gonorrhea: NEGATIVE
Trichomonas: POSITIVE — AB

## 2023-11-04 ENCOUNTER — Encounter: Payer: Self-pay | Admitting: Obstetrics

## 2023-11-06 ENCOUNTER — Ambulatory Visit: Payer: Self-pay | Admitting: Obstetrics & Gynecology

## 2023-11-06 DIAGNOSIS — B379 Candidiasis, unspecified: Secondary | ICD-10-CM

## 2023-11-06 DIAGNOSIS — A599 Trichomoniasis, unspecified: Secondary | ICD-10-CM

## 2023-11-06 DIAGNOSIS — N898 Other specified noninflammatory disorders of vagina: Secondary | ICD-10-CM

## 2023-11-06 MED ORDER — FLUCONAZOLE 100 MG PO TABS: 100.0000 mg | ORAL_TABLET | Freq: Every day | ORAL | 0 refills | Status: DC

## 2023-11-06 MED ORDER — METRONIDAZOLE 500 MG PO TABS: 2000.0000 mg | ORAL_TABLET | Freq: Once | ORAL | 0 refills | Status: AC

## 2023-11-07 ENCOUNTER — Other Ambulatory Visit: Payer: Self-pay

## 2023-11-07 DIAGNOSIS — Z01419 Encounter for gynecological examination (general) (routine) without abnormal findings: Secondary | ICD-10-CM

## 2023-11-07 DIAGNOSIS — B379 Candidiasis, unspecified: Secondary | ICD-10-CM

## 2023-11-07 MED ORDER — METRONIDAZOLE 500 MG PO TABS: 500.0000 mg | ORAL_TABLET | Freq: Two times a day (BID) | ORAL | 0 refills | Status: DC

## 2023-11-07 MED ORDER — FLUCONAZOLE 100 MG PO TABS: 100.0000 mg | ORAL_TABLET | Freq: Every day | ORAL | 0 refills | Status: AC

## 2023-12-12 ENCOUNTER — Ambulatory Visit: Admitting: Obstetrics

## 2023-12-26 ENCOUNTER — Other Ambulatory Visit (HOSPITAL_COMMUNITY)
Admission: RE | Admit: 2023-12-26 | Discharge: 2023-12-26 | Disposition: A | Discharge status: Home / Self Care | Source: Ambulatory Visit | Attending: Obstetrics and Gynecology | Admitting: Obstetrics and Gynecology

## 2023-12-26 ENCOUNTER — Ambulatory Visit (INDEPENDENT_AMBULATORY_CARE_PROVIDER_SITE_OTHER)

## 2023-12-26 VITALS — BP 113/75 | HR 102 | Wt 236.4 lb

## 2023-12-26 DIAGNOSIS — Z113 Encounter for screening for infections with a predominantly sexual mode of transmission: Secondary | ICD-10-CM | POA: Diagnosis present

## 2023-12-26 NOTE — Progress Notes (Signed)
 SUBJECTIVE:  28 y.o. female who desires a STI screen; TOC for trichomonas. Pt reports vaginal discharge, and would like to rule out bacterial vaginosis. Denies abnormal vaginal discharge, bleeding or significant pelvic pain. No UTI symptoms. Denies history of known exposure to STD.  No LMP recorded.  OBJECTIVE:  She appears well.   ASSESSMENT:  STI Screen   PLAN:  Pt offered STI blood screening-declined GC, chlamydia, and trichomonas probe sent to lab.  Treatment: To be determined once lab results are received.  Pt follow up as needed.

## 2023-12-28 LAB — CERVICOVAGINAL ANCILLARY ONLY
Bacterial Vaginitis (gardnerella): POSITIVE — AB
Candida Glabrata: NEGATIVE
Candida Vaginitis: NEGATIVE
Chlamydia: NEGATIVE
Comment: NEGATIVE
Comment: NEGATIVE
Comment: NEGATIVE
Comment: NEGATIVE
Comment: NEGATIVE
Comment: NORMAL
Neisseria Gonorrhea: NEGATIVE
Trichomonas: NEGATIVE

## 2023-12-29 ENCOUNTER — Ambulatory Visit: Payer: Self-pay | Admitting: Obstetrics and Gynecology

## 2023-12-29 MED ORDER — METRONIDAZOLE 500 MG PO TABS: 500.0000 mg | ORAL_TABLET | Freq: Two times a day (BID) | ORAL | 0 refills | Status: DC

## 2024-02-08 ENCOUNTER — Ambulatory Visit

## 2024-02-21 ENCOUNTER — Other Ambulatory Visit (HOSPITAL_COMMUNITY)
Admission: RE | Admit: 2024-02-21 | Discharge: 2024-02-21 | Disposition: A | Discharge status: Home / Self Care | Source: Ambulatory Visit | Attending: Obstetrics and Gynecology | Admitting: Obstetrics and Gynecology

## 2024-02-21 ENCOUNTER — Ambulatory Visit (INDEPENDENT_AMBULATORY_CARE_PROVIDER_SITE_OTHER)

## 2024-02-21 VITALS — BP 110/76 | HR 85

## 2024-02-21 DIAGNOSIS — N898 Other specified noninflammatory disorders of vagina: Secondary | ICD-10-CM | POA: Diagnosis present

## 2024-02-21 NOTE — Progress Notes (Signed)
..  SUBJECTIVE:  28 y.o. female complains of clear vaginal discharge for 1 week(s). Denies abnormal vaginal bleeding or significant pelvic pain or fever. No UTI symptoms. Denies history of known exposure to STD.  LMP 02/16/24  OBJECTIVE:  She appears well, afebrile. Urine dipstick: not done.  ASSESSMENT:  Vaginal Discharge    PLAN:  GC, chlamydia, trichomonas, BVAG, CVAG probe sent to lab. Treatment: To be determined once lab results are received ROV prn if symptoms persist or worsen.

## 2024-02-22 LAB — CERVICOVAGINAL ANCILLARY ONLY
Bacterial Vaginitis (gardnerella): POSITIVE — AB
Candida Glabrata: NEGATIVE
Candida Vaginitis: NEGATIVE
Chlamydia: NEGATIVE
Comment: NEGATIVE
Comment: NEGATIVE
Comment: NEGATIVE
Comment: NEGATIVE
Comment: NEGATIVE
Comment: NORMAL
Neisseria Gonorrhea: NEGATIVE
Trichomonas: NEGATIVE

## 2024-02-23 ENCOUNTER — Ambulatory Visit: Payer: Self-pay | Admitting: Obstetrics and Gynecology

## 2024-02-23 DIAGNOSIS — B9689 Other specified bacterial agents as the cause of diseases classified elsewhere: Secondary | ICD-10-CM

## 2024-02-23 MED ORDER — METRONIDAZOLE 0.75 % VA GEL: 1.0000 | Freq: Every day | VAGINAL | 1 refills | Status: AC

## 2024-03-11 ENCOUNTER — Other Ambulatory Visit: Payer: Self-pay

## 2024-03-11 ENCOUNTER — Encounter (HOSPITAL_BASED_OUTPATIENT_CLINIC_OR_DEPARTMENT_OTHER): Payer: Self-pay | Admitting: Emergency Medicine

## 2024-03-11 ENCOUNTER — Emergency Department (HOSPITAL_BASED_OUTPATIENT_CLINIC_OR_DEPARTMENT_OTHER): Admission: EM | Admit: 2024-03-11 | Discharge: 2024-03-11 | Disposition: A | Discharge status: Home / Self Care

## 2024-03-11 DIAGNOSIS — L02416 Cutaneous abscess of left lower limb: Secondary | ICD-10-CM | POA: Diagnosis present

## 2024-03-11 DIAGNOSIS — L0291 Cutaneous abscess, unspecified: Secondary | ICD-10-CM

## 2024-03-11 MED ORDER — DOXYCYCLINE HYCLATE 100 MG PO CAPS: 100.0000 mg | ORAL_CAPSULE | Freq: Two times a day (BID) | ORAL | 0 refills | Status: AC

## 2024-03-11 NOTE — Discharge Instructions (Addendum)
 We took a look at the abscess under ultrasound.  There is no drainable fluid collection.  Since it has been draining on its own we will start you on antibiotics to make sure it does not get worse.  Please follow-up with your primary care doctor during the upcoming week.  Return to the emergency department for any emergent symptoms.

## 2024-03-11 NOTE — ED Triage Notes (Signed)
 Pt reports abscess to LT upper inner thigh; reports it busted and is still draining

## 2024-03-11 NOTE — ED Provider Notes (Addendum)
 [facility] Provider Note   CSN: [id number] Arrival date & time: 03/11/24  1330     Patient presents with: Abscess   Nicole Woodard is a 28 y.o. female.   28 female presents today for concern of an abscess to her left inner thigh.  She states that 1 week ago she might have been bit by something.  She had a small painful bump in the area which then later turned into a whitehead and a few days ago he noticed that it popped on its own almost daily.  She states after the emergency department somewhat.  Denies any fever complaints.  Has not been applying warm compress.  Has not tried anything else prior to arriving.  The history is provided by the patient. No language interpreter was used.       Prior to Admission medications   Medication Sig Start Date End Date Taking? Authorizing Provider  fluconazole  (DIFLUCAN ) 100 MG tablet Take 1 tablet (100 mg total) by mouth daily. Take after completing Metronidazole  Patient not taking: Reported on 02/21/2024 11/07/23   Erik Kieth BROCKS, MD  metroNIDAZOLE  (METROGEL ) 0.75 % vaginal gel Place 1 Applicatorful vaginally at bedtime. Apply one applicatorful to vagina at bedtime for 5 days 02/23/24   Abigail Rollo DASEN, MD  Norethindrone Acetate-Ethinyl Estrad-FE (AUROVELA  24 FE) 1-20 MG-MCG(24) tablet Take 1 tablet by mouth daily. 06/08/23   Eveline Lynwood MATSU, MD    Allergies: Patient has no known allergies.    Review of Systems  Constitutional:  Negative for chills and fever.  Skin:  Positive for wound.  All other systems reviewed and are negative.   Updated Vital Signs BP (!) 122/90 (BP Location: Right Arm)   Pulse (!) 120   Temp 98.9 F (37.2 C) (Oral)   Resp 20   Ht 5' 6 (1.676 m)   Wt 99.8 kg   LMP 02/16/2024   SpO2 99%   BMI 35.51 kg/m   Physical Exam Vitals and nursing note reviewed.  Constitutional:      General: She is not in acute distress.    Appearance: Normal appearance. She  is not ill-appearing.  HENT:     Head: Normocephalic and atraumatic.     Nose: Nose normal.  Eyes:     Conjunctiva/sclera: Conjunctivae normal.  Cardiovascular:     Rate and Rhythm: Normal rate and regular rhythm.  Pulmonary:     Effort: Pulmonary effort is normal. No respiratory distress.  Musculoskeletal:        General: No deformity.  Skin:    Findings: No rash.     Comments: Area of induration with central opening noted to left thigh.  No active drainage.  No surrounding erythema.  No fluctuance on exam. [employer] PA student present as chaperone  Neurological:     Mental Status: She is alert.     (all labs ordered are listed, but only abnormal results are displayed) Labs Reviewed - No data to display  EKG: None  Radiology: No results found.   Procedures   Medications Ordered in the ED - No data to display                                  Medical Decision Making Risk Prescription drug management.   28 year old female presents today for concern of an abscess to her left inner thigh.  Has been going on for  about a week but several days ago she states it came to ahead and spontaneously drained itself.  Since then she has been having some purulent drainage.  She states this stopped when she arrived to the emergency department. Tachycardic but afebrile.  Denies fever at home. No induration on exam. No drainable fluid collection on ultrasound. Doxycycline  prescribed. Discharged in stable condition. She will follow-up with her primary care doctor in the upcoming week.  Initially noted to be tachycardic at 120.  But during my evaluation patient's heart rate was 80.  She is afebrile.  No concern for sepsis.  Advised nursing to update vital signs.   Final diagnoses:  Abscess    ED Discharge Orders          Ordered    doxycycline  (VIBRAMYCIN ) 100 MG capsule  2 times daily        03/11/24 1513               Hildegard Loge, PA-C 03/11/24 1515    [address],  PA-C 03/11/24 1518    Neysa Caron PARAS, DO 03/11/24 1527

## 2024-05-01 ENCOUNTER — Ambulatory Visit: Payer: Self-pay

## 2024-05-01 ENCOUNTER — Other Ambulatory Visit (HOSPITAL_COMMUNITY)
Admission: RE | Admit: 2024-05-01 | Discharge: 2024-05-01 | Disposition: A | Discharge status: Home / Self Care | Source: Ambulatory Visit | Attending: Obstetrics and Gynecology | Admitting: Obstetrics and Gynecology

## 2024-05-01 VITALS — BP 117/66 | HR 74 | Wt 238.4 lb

## 2024-05-01 DIAGNOSIS — N898 Other specified noninflammatory disorders of vagina: Secondary | ICD-10-CM | POA: Diagnosis not present

## 2024-05-01 NOTE — Progress Notes (Signed)
..  SUBJECTIVE:  28 y.o. female complains of vaginal discharge for 1.5 week(s). Denies abnormal vaginal bleeding or significant pelvic pain or fever. No UTI symptoms. Denies history of known exposure to STD.  LMP 04/16/24  OBJECTIVE:  She appears well, afebrile. Urine dipstick: not done.  ASSESSMENT:  Vaginal Discharge    PLAN:  GC, chlamydia, trichomonas, BVAG, CVAG probe sent to lab. Treatment: To be determined once lab results are received ROV prn if symptoms persist or worsen.

## 2024-05-02 ENCOUNTER — Ambulatory Visit: Payer: Self-pay | Admitting: Obstetrics and Gynecology

## 2024-05-02 LAB — CERVICOVAGINAL ANCILLARY ONLY
Bacterial Vaginitis (gardnerella): POSITIVE — AB
Candida Glabrata: NEGATIVE
Candida Vaginitis: POSITIVE — AB
Chlamydia: NEGATIVE
Comment: NEGATIVE
Comment: NEGATIVE
Comment: NEGATIVE
Comment: NEGATIVE
Comment: NEGATIVE
Comment: NORMAL
Neisseria Gonorrhea: NEGATIVE
Trichomonas: NEGATIVE

## 2024-05-02 MED ORDER — FLUCONAZOLE 150 MG PO TABS: 150.0000 mg | ORAL_TABLET | Freq: Once | ORAL | 0 refills | Status: AC

## 2024-05-02 MED ORDER — METRONIDAZOLE 500 MG PO TABS: 500.0000 mg | ORAL_TABLET | Freq: Two times a day (BID) | ORAL | 0 refills | Status: AC
